# Patient Record
Sex: Female | Born: 1960 | Race: White | Hispanic: No | Smoking: Former smoker
Health system: Southern US, Community
[De-identification: ages and names within clinical notes are randomized; demographics above are authoritative.]

## PROBLEM LIST (undated history)

## (undated) DIAGNOSIS — I2510 Atherosclerotic heart disease of native coronary artery without angina pectoris: Secondary | ICD-10-CM

## (undated) DIAGNOSIS — I10 Essential (primary) hypertension: Secondary | ICD-10-CM

## (undated) DIAGNOSIS — J449 Chronic obstructive pulmonary disease, unspecified: Secondary | ICD-10-CM

## (undated) DIAGNOSIS — I509 Heart failure, unspecified: Secondary | ICD-10-CM

## (undated) DIAGNOSIS — E785 Hyperlipidemia, unspecified: Secondary | ICD-10-CM

## (undated) HISTORY — DX: Atherosclerotic heart disease of native coronary artery without angina pectoris: I25.10

## (undated) HISTORY — DX: Essential (primary) hypertension: I10

## (undated) HISTORY — PX: CARDIAC CATHETERIZATION: SHX172

## (undated) HISTORY — DX: Heart failure, unspecified: I50.9

## (undated) HISTORY — DX: Hyperlipidemia, unspecified: E78.5

---

## 2021-07-07 ENCOUNTER — Other Ambulatory Visit: Payer: Self-pay

## 2021-07-07 ENCOUNTER — Emergency Department (HOSPITAL_COMMUNITY): Payer: Self-pay

## 2021-07-07 ENCOUNTER — Inpatient Hospital Stay (HOSPITAL_COMMUNITY): Payer: Self-pay

## 2021-07-07 ENCOUNTER — Encounter (HOSPITAL_COMMUNITY): Payer: Self-pay

## 2021-07-07 ENCOUNTER — Inpatient Hospital Stay (HOSPITAL_COMMUNITY)
Admission: EM | Admit: 2021-07-07 | Discharge: 2021-07-09 | DRG: 287 | Disposition: A | Discharge status: Home / Self Care | Payer: Self-pay | Attending: Cardiology | Admitting: Cardiology

## 2021-07-07 DIAGNOSIS — I5181 Takotsubo syndrome: Principal | ICD-10-CM | POA: Diagnosis present

## 2021-07-07 DIAGNOSIS — Z20822 Contact with and (suspected) exposure to covid-19: Secondary | ICD-10-CM | POA: Diagnosis present

## 2021-07-07 DIAGNOSIS — E785 Hyperlipidemia, unspecified: Secondary | ICD-10-CM | POA: Diagnosis present

## 2021-07-07 DIAGNOSIS — I214 Non-ST elevation (NSTEMI) myocardial infarction: Principal | ICD-10-CM

## 2021-07-07 DIAGNOSIS — I25118 Atherosclerotic heart disease of native coronary artery with other forms of angina pectoris: Secondary | ICD-10-CM | POA: Diagnosis present

## 2021-07-07 DIAGNOSIS — J449 Chronic obstructive pulmonary disease, unspecified: Secondary | ICD-10-CM | POA: Diagnosis present

## 2021-07-07 DIAGNOSIS — I10 Essential (primary) hypertension: Secondary | ICD-10-CM | POA: Diagnosis present

## 2021-07-07 DIAGNOSIS — Z87891 Personal history of nicotine dependence: Secondary | ICD-10-CM

## 2021-07-07 DIAGNOSIS — I252 Old myocardial infarction: Secondary | ICD-10-CM

## 2021-07-07 DIAGNOSIS — R739 Hyperglycemia, unspecified: Secondary | ICD-10-CM | POA: Diagnosis present

## 2021-07-07 DIAGNOSIS — I472 Ventricular tachycardia, unspecified: Secondary | ICD-10-CM | POA: Diagnosis present

## 2021-07-07 HISTORY — DX: Chronic obstructive pulmonary disease, unspecified: J44.9

## 2021-07-07 LAB — MRSA NEXT GEN BY PCR, NASAL: MRSA by PCR Next Gen: NOT DETECTED

## 2021-07-07 LAB — BASIC METABOLIC PANEL
Anion gap: 10 (ref 5–15)
Anion gap: 10 (ref 5–15)
BUN: 15 mg/dL (ref 8–23)
BUN: 12 mg/dL (ref 8–23)
CO2: 25 mmol/L (ref 22–32)
CO2: 23 mmol/L (ref 22–32)
Calcium: 9.1 mg/dL (ref 8.9–10.3)
Calcium: 9 mg/dL (ref 8.9–10.3)
Chloride: 102 mmol/L (ref 98–111)
Chloride: 106 mmol/L (ref 98–111)
Creatinine, Ser: 0.98 mg/dL (ref 0.44–1.00)
Creatinine, Ser: 0.82 mg/dL (ref 0.44–1.00)
GFR, Estimated: >60 mL/min (ref >60)
GFR, Estimated: >60 mL/min (ref >60)
Glucose, Bld: 124 mg/dL — ABNORMAL HIGH (ref 70–99)
Glucose, Bld: 97 mg/dL (ref 70–99)
Potassium: 4.3 mmol/L (ref 3.5–5.1)
Potassium: 4.1 mmol/L (ref 3.5–5.1)
Sodium: 137 mmol/L (ref 135–145)
Sodium: 139 mmol/L (ref 135–145)

## 2021-07-07 LAB — CBC
HCT: 41 % (ref 36.0–46.0)
Hemoglobin: 13.2 g/dL (ref 12.0–15.0)
MCH: 29.6 pg (ref 26.0–34.0)
MCHC: 32.2 g/dL (ref 30.0–36.0)
MCV: 91.9 fL (ref 80.0–100.0)
Platelets: 242 10*3/uL (ref 150–400)
RBC: 4.46 MIL/uL (ref 3.87–5.11)
RDW: 12.3 % (ref 11.5–15.5)
WBC: 5.1 10*3/uL (ref 4.0–10.5)
nRBC: 0 % (ref 0.0–0.2)

## 2021-07-07 LAB — RESP PANEL BY RT-PCR (FLU A&B, COVID) ARPGX2
Influenza A by PCR: NEGATIVE
Influenza B by PCR: NEGATIVE
SARS Coronavirus 2 by RT PCR: NEGATIVE

## 2021-07-07 LAB — HEMOGLOBIN A1C
Hgb A1c MFr Bld: 5.1 % (ref 4.8–5.6)
Mean Plasma Glucose: 99.67 mg/dL

## 2021-07-07 LAB — D-DIMER, QUANTITATIVE: D-Dimer, Quant: <0.27 ug/mL-FEU (ref 0.00–0.50)

## 2021-07-07 LAB — TROPONIN I (HIGH SENSITIVITY)
Troponin I (High Sensitivity): 739 ng/L (ref <18)
Troponin I (High Sensitivity): 2783 ng/L (ref <18)
Troponin I (High Sensitivity): 2909 ng/L (ref <18)
Troponin I (High Sensitivity): 2584 ng/L (ref <18)
Troponin I (High Sensitivity): 2038 ng/L (ref <18)

## 2021-07-07 LAB — PROTIME-INR
INR: 1 (ref 0.8–1.2)
Prothrombin Time: 13.6 seconds (ref 11.4–15.2)

## 2021-07-07 LAB — HEPARIN LEVEL (UNFRACTIONATED): Heparin Unfractionated: 0.19 IU/mL — ABNORMAL LOW (ref 0.30–0.70)

## 2021-07-07 LAB — BRAIN NATRIURETIC PEPTIDE: B Natriuretic Peptide: 39.6 pg/mL (ref 0.0–100.0)

## 2021-07-07 MED ORDER — CLOPIDOGREL BISULFATE 300 MG PO TABS
300.0000 mg | ORAL_TABLET | ORAL | Status: AC
  Administered 2021-07-08: 300 mg via ORAL
  Filled 2021-07-07: qty 1

## 2021-07-07 MED ORDER — PANTOPRAZOLE SODIUM 40 MG PO TBEC
40.0000 mg | DELAYED_RELEASE_TABLET | Freq: Every day | ORAL | Status: DC
  Administered 2021-07-08 – 2021-07-09 (×2): 40 mg via ORAL
  Filled 2021-07-07 (×2): qty 1

## 2021-07-07 MED ORDER — NITROGLYCERIN IN D5W 200-5 MCG/ML-% IV SOLN: 5.0000 ug/min | INTRAVENOUS | Status: DC

## 2021-07-07 MED ORDER — SODIUM CHLORIDE 0.9 % IV SOLN: INTRAVENOUS | Status: DC

## 2021-07-07 MED ORDER — SODIUM CHLORIDE 0.9% FLUSH: 3.0000 mL | INTRAVENOUS | Status: DC | PRN

## 2021-07-07 MED ORDER — SODIUM CHLORIDE 0.9 % WEIGHT BASED INFUSION
1.0000 mL/kg/h | INTRAVENOUS | Status: DC
  Administered 2021-07-07: 1 mL/kg/h via INTRAVENOUS

## 2021-07-07 MED ORDER — CHLORHEXIDINE GLUCONATE CLOTH 2 % EX PADS
6.0000 | MEDICATED_PAD | Freq: Every day | CUTANEOUS | Status: DC
  Administered 2021-07-08: 6 via TOPICAL

## 2021-07-07 MED ORDER — LOSARTAN POTASSIUM 25 MG PO TABS
12.5000 mg | ORAL_TABLET | Freq: Every day | ORAL | Status: DC
  Administered 2021-07-08: 12.5 mg via ORAL
  Filled 2021-07-07: qty 1

## 2021-07-07 MED ORDER — ASPIRIN EC 81 MG PO TBEC
81.0000 mg | DELAYED_RELEASE_TABLET | Freq: Every day | ORAL | Status: DC
  Administered 2021-07-09: 81 mg via ORAL
  Filled 2021-07-07: qty 1

## 2021-07-07 MED ORDER — NITROGLYCERIN 0.4 MG SL SUBL: 0.4000 mg | SUBLINGUAL_TABLET | SUBLINGUAL | Status: DC | PRN

## 2021-07-07 MED ORDER — MORPHINE SULFATE (PF) 4 MG/ML IV SOLN
4.0000 mg | Freq: Once | INTRAVENOUS | Status: AC
  Administered 2021-07-07: 4 mg via INTRAVENOUS
  Filled 2021-07-07: qty 1

## 2021-07-07 MED ORDER — HEPARIN (PORCINE) 25000 UT/250ML-% IV SOLN
700.0000 [IU]/h | INTRAVENOUS | Status: DC
  Administered 2021-07-07: 600 [IU]/h via INTRAVENOUS
  Filled 2021-07-07: qty 250

## 2021-07-07 MED ORDER — METOPROLOL TARTRATE 12.5 MG HALF TABLET
12.5000 mg | ORAL_TABLET | Freq: Two times a day (BID) | ORAL | Status: DC
  Administered 2021-07-07 – 2021-07-08 (×2): 12.5 mg via ORAL
  Filled 2021-07-07 (×3): qty 1

## 2021-07-07 MED ORDER — SODIUM CHLORIDE 0.9% FLUSH
3.0000 mL | Freq: Two times a day (BID) | INTRAVENOUS | Status: DC
  Administered 2021-07-07 – 2021-07-08 (×3): 3 mL via INTRAVENOUS

## 2021-07-07 MED ORDER — SODIUM CHLORIDE 0.9 % IV SOLN: 250.0000 mL | INTRAVENOUS | Status: DC | PRN

## 2021-07-07 MED ORDER — ACETAMINOPHEN 325 MG PO TABS: 650.0000 mg | ORAL_TABLET | ORAL | Status: DC | PRN

## 2021-07-07 MED ORDER — ASPIRIN 81 MG PO CHEW
81.0000 mg | CHEWABLE_TABLET | ORAL | Status: AC
  Administered 2021-07-08: 81 mg via ORAL
  Filled 2021-07-07: qty 1

## 2021-07-07 MED ORDER — CLOPIDOGREL BISULFATE 300 MG PO TABS
300.0000 mg | ORAL_TABLET | Freq: Once | ORAL | Status: AC
  Administered 2021-07-07: 300 mg via ORAL
  Filled 2021-07-07: qty 1

## 2021-07-07 MED ORDER — ATORVASTATIN CALCIUM 80 MG PO TABS
80.0000 mg | ORAL_TABLET | Freq: Every day | ORAL | Status: DC
  Administered 2021-07-07 – 2021-07-09 (×3): 80 mg via ORAL
  Filled 2021-07-07 (×3): qty 1

## 2021-07-07 MED ORDER — NITROGLYCERIN 2 % TD OINT
0.5000 [in_us] | TOPICAL_OINTMENT | Freq: Once | TRANSDERMAL | Status: AC
  Administered 2021-07-07: 0.5 [in_us] via TOPICAL
  Filled 2021-07-07: qty 1

## 2021-07-07 MED ORDER — HEPARIN BOLUS VIA INFUSION
3000.0000 [IU] | Freq: Once | INTRAVENOUS | Status: AC
  Administered 2021-07-07: 3000 [IU] via INTRAVENOUS
  Filled 2021-07-07: qty 3000

## 2021-07-07 MED ORDER — ONDANSETRON HCL 4 MG/2ML IJ SOLN: 4.0000 mg | Freq: Four times a day (QID) | INTRAMUSCULAR | Status: DC | PRN

## 2021-07-07 NOTE — H&P (Signed)
Kaelynne Void is an 61 y.o. female.   Chief Complaint: Chest pain associated with shortness of breath HPI: Patient is a 61 year old female with past medical history significant for COPD, tobacco abuse smoked 1 pack/day for 30+ years quit approximately 5 years ago came to ER by EMS complaining of retrosternal chest pain radiating to left arm back and neck and left shoulder associated with diaphoresis and shortness of breath which started around 7:30 AM patient took two 325 mg aspirin prior to EMS arrival and then received 2 sublingual nitros with marked relief of chest pain and was started on IV heparin and Nitropaste with relief of chest pain in the ED.  EKG done in the ED showed normal sinus rhythm with Q waves in V1 to V3 suggestive of anterior MI age undetermined patient presently denies any chest pain but states has been having chest pressure off and on for last 2 weeks described as heaviness graded 5/10 relieved with rest states chest pressure has lasted anywhere between 15 to 20 minutes each time but has not gotten any medical attention as she thought this was related to her COPD.  2 sets of high-sensitivity troponin are elevated.  Past Medical History:  Diagnosis Date   COPD (chronic obstructive pulmonary disease) ([facility])       No family history on file. Social History:  reports that she has quit smoking. Her smoking use included cigarettes. She has never used smokeless tobacco. She reports that she does not currently use alcohol. She reports that she does not use drugs.  Allergies: No Known Allergies  (Not in a hospital admission)   Results for orders placed or performed during the hospital encounter of 07/07/21 (from the past 48 hour(s))  Basic metabolic panel     Status: Abnormal   Collection Time: 07/07/21  9:34 AM  Result Value Ref Range   Sodium 137 135 - 145 mmol/L   Potassium 4.3 3.5 - 5.1 mmol/L   Chloride 102 98 - 111 mmol/L   CO2 25 22 - 32 mmol/L   Glucose, Bld 124 (H) 70  - 99 mg/dL    Comment: Glucose reference range applies only to samples taken after fasting for at least 8 hours.   BUN 15 8 - 23 mg/dL   Creatinine, Ser 0.98 0.44 - 1.00 mg/dL   Calcium 9.1 8.9 - 10.3 mg/dL   GFR, Estimated >60 >60 mL/min   Anion gap 10 5 - 15    Comment: Performed at [facility] [address], [place], [place] [address]  CBC     Status: None   Collection Time: 07/07/21  9:34 AM  Result Value Ref Range   WBC 5.1 4.0 - 10.5 K/uL   RBC 4.46 3.87 - 5.11 MIL/uL   Hemoglobin 13.2 12.0 - 15.0 g/dL   HCT 41.0 36.0 - 46.0 %   MCV 91.9 80.0 - 100.0 fL   MCH 29.6 26.0 - 34.0 pg   MCHC 32.2 30.0 - 36.0 g/dL   RDW 12.3 11.5 - 15.5 %   Platelets 242 150 - 400 K/uL   nRBC 0.0 0.0 - 0.2 %    Comment: Performed at [facility], [place] [address], [place], [place] [address]  Troponin I (High Sensitivity)     Status: Abnormal   Collection Time: 07/07/21  9:34 AM  Result Value Ref Range   Troponin I (High Sensitivity) 739 (HH) <18 ng/L    Comment: CRITICAL RESULT CALLED TO, READ BACK BY  AND VERIFIED WITH: I.EVANS,RN 07/07/2021 AT 1102 A.HUGHES Performed at [facility], [place] [address], [place], [place] [address]   Brain natriuretic peptide     Status: None   Collection Time: 07/07/21  9:34 AM  Result Value Ref Range   B Natriuretic Peptide 39.6 0.0 - 100.0 pg/mL    Comment: Performed at [facility] [address], [place], [place] [address]  D-dimer, quantitative     Status: None   Collection Time: 07/07/21 10:12 AM  Result Value Ref Range   D-Dimer, Quant <0.27 0.00 - 0.50 ug/mL-FEU    Comment: (NOTE) At the manufacturer cut-off value of 0.5 g/mL FEU, this assay has a negative predictive value of 95-100%.This assay is intended for use in conjunction with a clinical pretest probability (PTP) assessment model to exclude pulmonary embolism (PE) and deep venous thrombosis (DVT) in outpatients suspected of PE or DVT. Results should be correlated with  clinical presentation. Performed at [facility], [place] [address] [place], [place] [address]   Protime-INR     Status: None   Collection Time: 07/07/21 10:12 AM  Result Value Ref Range   Prothrombin Time 13.6 11.4 - 15.2 seconds   INR 1.0 0.8 - 1.2    Comment: (NOTE) INR goal varies based on device and disease states. Performed at [facility], [place] [address], [place], [place] [address]   Troponin I (High Sensitivity)     Status: Abnormal   Collection Time: 07/07/21 11:22 AM  Result Value Ref Range   Troponin I (High Sensitivity) 2,783 (HH) <18 ng/L    Comment: CRITICAL VALUE NOTED.  VALUE IS CONSISTENT WITH PREVIOUSLY REPORTED AND CALLED VALUE. (NOTE) Elevated high sensitivity troponin I (hsTnI) values and significant  changes across serial measurements may suggest ACS but many other  chronic and acute conditions are known to elevate hsTnI results.  Refer to the Links section for chest pain algorithms and additional  guidance. Performed at [facility], [place] [address], [place],  [address]   Resp Panel by RT-PCR (Flu A&B, Covid) Nasopharyngeal Swab     Status: None   Collection Time: 07/07/21 11:25 AM   Specimen: Nasopharyngeal Swab; Nasopharyngeal(NP) swabs in vial transport medium  Result Value Ref Range   SARS Coronavirus 2 by RT PCR NEGATIVE NEGATIVE    Comment: (NOTE) SARS-CoV-2 target nucleic acids are NOT DETECTED.  The SARS-CoV-2 RNA is generally detectable in upper respiratory specimens during the acute phase of infection. The lowest concentration of SARS-CoV-2 viral copies this assay can detect is 138 copies/mL. A negative result does not preclude SARS-Cov-2 infection and should not be used as the sole basis for treatment or other patient management decisions. A negative result may occur with  improper specimen collection/handling, submission of specimen other than nasopharyngeal swab, presence of viral mutation(s) within the areas  targeted by this assay, and inadequate number of viral copies(<138 copies/mL). A negative result must be combined with clinical observations, patient history, and epidemiological information. The expected result is Negative.  Fact Sheet for Patients:  EntrepreneurPulse.com.au  Fact Sheet for Healthcare Providers:  IncredibleEmployment.be  This test is no t yet approved or cleared by the [place] FDA and  has been authorized for detection and/or diagnosis of SARS-CoV-2 by FDA under an Emergency Use Authorization (EUA). This EUA will remain  in effect (meaning this test can be used) for the duration of the COVID-19 declaration under Section 564(b)(1) of the Act, 21  U.S.C.section 360bbb-3(b)(1), unless the authorization is terminated  or revoked sooner.       Influenza A by PCR NEGATIVE NEGATIVE   Influenza B by PCR NEGATIVE NEGATIVE    Comment: (NOTE) The Xpert Xpress SARS-CoV-2/FLU/RSV plus assay is intended as an aid in the diagnosis of influenza from Nasopharyngeal swab specimens and should not be used as a sole basis for treatment. Nasal washings and aspirates are unacceptable for Xpert Xpress SARS-CoV-2/FLU/RSV testing.  Fact Sheet for Patients: EntrepreneurPulse.com.au  Fact Sheet for Healthcare Providers: IncredibleEmployment.be  This test is not yet approved or cleared by the [place] FDA and has been authorized for detection and/or diagnosis of SARS-CoV-2 by FDA under an Emergency Use Authorization (EUA). This EUA will remain in effect (meaning this test can be used) for the duration of the COVID-19 declaration under Section 564(b)(1) of the Act, 21 U.S.C. section 360bbb-3(b)(1), unless the authorization is terminated or revoked.  Performed at [facility], [place] [address], [place], [place] [address]    DG Chest Port 1 View  Result Date: 07/07/2021 CLINICAL DATA:  Chest pain  EXAM: PORTABLE CHEST 1 VIEW COMPARISON:  None. FINDINGS: The heart size and mediastinal contours are within normal limits. Both lungs are clear. Definitively benign, calcified nodule of the right pulmonary apex. The visualized skeletal structures are unremarkable. IMPRESSION: No acute abnormality of the lungs in AP portable projection. Electronically Signed   By: Delanna Ahmadi M.D.   On: 07/07/2021 10:06    Review of Systems  Constitutional:  Positive for diaphoresis. Negative for fever.  HENT:  Negative for sore throat.   Eyes:  Negative for discharge.  Respiratory:  Positive for shortness of breath.   Cardiovascular:  Positive for chest pain. Negative for palpitations and leg swelling.  Gastrointestinal:  Negative for abdominal distention.  Genitourinary:  Negative for difficulty urinating.  Neurological:  Negative for dizziness.   Blood pressure 116/89, pulse 93, temperature 97.6 F (36.4 C), temperature source Oral, resp. rate 17, height 5\' 1"  (1.549 m), weight 47.3 kg, SpO2 98 %. Physical Exam Constitutional:      Appearance: She is normal weight.  HENT:     Head: Normocephalic and atraumatic.  Eyes:     Extraocular Movements: Extraocular movements intact.     Pupils: Pupils are equal, round, and reactive to light.  Neck:     Vascular: No JVD.  Cardiovascular:     Rate and Rhythm: Normal rate and regular rhythm.     Heart sounds: Murmur heard.  No systolic (Soft systolic murmur noted) murmur is present.    No S3 sounds.  Pulmonary:     Effort: Pulmonary effort is normal.     Breath sounds: Normal breath sounds.  Abdominal:     General: Bowel sounds are normal. There is no abdominal bruit.     Palpations: Abdomen is soft. There is no hepatomegaly.  Musculoskeletal:     Right lower leg: No tenderness. No edema.     Left lower leg: No tenderness. No edema.  Skin:    General: Skin is warm and dry.  Neurological:     General: No focal deficit present.     Mental Status: She  is alert and oriented to person, place, and time.     Assessment/Plan Acute NSTEMI Probably recent anteroseptal wall MI COPD History of tobacco abuse Elevated blood sugar rule out diabetes mellitus Plan As per orders Discussed with patient and her husband at length regarding left cardiac catheterization possible  PTCA stenting its risk and benefits i.e. death MI stroke need for emergency CABG local vascular complications etc., radial versus femoral approach its risk and benefits and consents for the procedure..  Patient presently is chest pain-free we will schedule for tomorrow unless starts having recurrent chest pain or new EKG changes.  Charolette Forward, MD 07/07/2021, 1:25 PM

## 2021-07-07 NOTE — ED Provider Notes (Signed)
[facility] EMERGENCY DEPARTMENT Provider Note   CSN: [id number] Arrival date & time: 07/07/21  [id number]     History  Chief Complaint  Patient presents with   Chest Pain    Pamela Mcclain is a 61 y.o. female.  She has a history of COPD, former smoker, no home oxygen.  Complaining of acute onset of shortness of breath and substernal chest pain radiating to left shoulder and neck 10 out of 10 intensity while she was feeding her birds in the yard.  No prior history of chest pain.  Associated with diaphoresis.  She took 2 regular strength aspirin and EMS administered 2 nitroglycerin with improvement in pain down to 3 out of 10.  Denies any cocaine.  HPI  HPI: A 61 year old patient presents for evaluation of chest pain. Initial onset of pain was approximately 1-3 hours ago. The patient's chest pain is described as heaviness/pressure/tightness, is not worse with exertion and is relieved by nitroglycerin. The patient reports some diaphoresis. The patient's chest pain is middle- or left-sided, is not well-localized, is not sharp and does radiate to the arms/jaw/neck. The patient does not complain of nausea. The patient has no history of stroke, has no history of peripheral artery disease, has not smoked in the past 90 days, denies any history of treated diabetes, has no relevant family history of coronary artery disease (first degree relative at less than age 55), is not hypertensive, has no history of hypercholesterolemia and does not have an elevated BMI (>=30).   Home Medications Prior to Admission medications   Not on File      Allergies    Patient has no known allergies.    Review of Systems   Review of Systems  Constitutional:  Positive for diaphoresis. Negative for fever.  HENT:  Negative for sore throat.   Respiratory:  Positive for shortness of breath.   Cardiovascular:  Positive for chest pain.  Gastrointestinal:  Negative for abdominal pain.  Genitourinary:   Negative for dysuria.  Musculoskeletal:  Positive for neck pain.  Skin:  Negative for rash.  Neurological:  Negative for headaches.   Physical Exam Updated Vital Signs BP (!) 147/97    Pulse 97    Temp 97.6 F (36.4 C) (Oral)    Resp (!) 23    Ht 5\' 1"  (1.549 m)    Wt 49.9 kg    SpO2 100%    BMI 20.78 kg/m  Physical Exam Vitals and nursing note reviewed.  Constitutional:      General: She is not in acute distress.    Appearance: Normal appearance. She is well-developed.  HENT:     Head: Normocephalic and atraumatic.  Eyes:     Conjunctiva/sclera: Conjunctivae normal.  Cardiovascular:     Rate and Rhythm: Normal rate and regular rhythm.     Heart sounds: Normal heart sounds. No murmur heard. Pulmonary:     Effort: Tachypnea and accessory muscle usage present. No respiratory distress.     Breath sounds: Normal breath sounds. No stridor. No wheezing.  Abdominal:     Palpations: Abdomen is soft.     Tenderness: There is no abdominal tenderness. There is no guarding or rebound.  Musculoskeletal:        General: No tenderness or deformity. Normal range of motion.     Cervical back: Neck supple.     Right lower leg: No tenderness. No edema.     Left lower leg: No tenderness. No edema.  Skin:  General: Skin is warm and dry.     Capillary Refill: Capillary refill takes less than 2 seconds.  Neurological:     General: No focal deficit present.     Mental Status: She is alert.     GCS: GCS eye subscore is 4. GCS verbal subscore is 5. GCS motor subscore is 6.    ED Results / Procedures / Treatments   Labs (all labs ordered are listed, but only abnormal results are displayed) Labs Reviewed  BASIC METABOLIC PANEL - Abnormal; Notable for the following components:      Result Value   Glucose, Bld 124 (*)    All other components within normal limits  TROPONIN I (HIGH SENSITIVITY) - Abnormal; Notable for the following components:   Troponin I (High Sensitivity) 739 (*)    All  other components within normal limits  TROPONIN I (HIGH SENSITIVITY) - Abnormal; Notable for the following components:   Troponin I (High Sensitivity) 2,783 (*)    All other components within normal limits  TROPONIN I (HIGH SENSITIVITY) - Abnormal; Notable for the following components:   Troponin I (High Sensitivity) 2,909 (*)    All other components within normal limits  RESP PANEL BY RT-PCR (FLU A&B, COVID) ARPGX2  MRSA NEXT GEN BY PCR, NASAL  CBC  BRAIN NATRIURETIC PEPTIDE  D-DIMER, QUANTITATIVE (NOT AT [facility])  PROTIME-INR  HEMOGLOBIN [id number]  BASIC METABOLIC PANEL  HEPARIN LEVEL (UNFRACTIONATED)  HIV ANTIBODY (ROUTINE TESTING W REFLEX)  HEPARIN LEVEL (UNFRACTIONATED)  BASIC METABOLIC PANEL  LIPID PANEL  CBC    EKG EKG Interpretation  Date/Time:  Sunday July 07 2021 09:24:04 EST Ventricular Rate:  101 PR Interval:  148 QRS Duration: 72 QT Interval:  329 QTC Calculation: 427 R Axis:   84 Text Interpretation: Sinus tachycardia Anterior infarct, old No old tracing to compare Confirmed by Aletta Edouard [phone]) on 07/07/2021 9:25:35 AM  Radiology DG Chest Port 1 View  Result Date: 07/07/2021 CLINICAL DATA:  Chest pain EXAM: PORTABLE CHEST 1 VIEW COMPARISON:  None. FINDINGS: The heart size and mediastinal contours are within normal limits. Both lungs are clear. Definitively benign, calcified nodule of the right pulmonary apex. The visualized skeletal structures are unremarkable. IMPRESSION: No acute abnormality of the lungs in AP portable projection. Electronically Signed   By: Delanna Ahmadi M.D.   On: 07/07/2021 10:06    Procedures .Critical Care Performed by: Hayden Rasmussen, MD Authorized by: Hayden Rasmussen, MD   Critical care provider statement:    Critical care time (minutes):  45   Critical care time was exclusive of:  Separately billable procedures and treating other patients   Critical care was necessary to treat or prevent imminent or life-threatening  deterioration of the following conditions:  Cardiac failure   Critical care was time spent personally by me on the following activities:  Development of treatment plan with patient or surrogate, discussions with consultants, evaluation of patient's response to treatment, examination of patient, obtaining history from patient or surrogate, ordering and performing treatments and interventions, ordering and review of laboratory studies, ordering and review of radiographic studies, pulse oximetry, re-evaluation of patient's condition and review of old charts   I assumed direction of critical care for this patient from another provider in my specialty: no      Medications Ordered in ED Medications  heparin ADULT infusion 100 units/mL (25000 units/25mL) (600 Units/hr Intravenous Infusion Verify 07/07/21 1600)  aspirin EC tablet 81 mg (has no administration in time range)  nitroGLYCERIN (NITROSTAT) SL tablet 0.4 mg (has no administration in time range)  acetaminophen (TYLENOL) tablet 650 mg (has no administration in time range)  ondansetron (ZOFRAN) injection 4 mg (has no administration in time range)  sodium chloride flush (NS) 0.9 % injection 3 mL (3 mLs Intravenous Given 07/07/21 1600)  sodium chloride flush (NS) 0.9 % injection 3 mL (has no administration in time range)  0.9 %  sodium chloride infusion (has no administration in time range)  aspirin chewable tablet 81 mg (has no administration in time range)  0.9 %  sodium chloride infusion ( Intravenous New Bag/Given 07/07/21 1625)  nitroGLYCERIN 50 mg in dextrose 5 % 250 mL (0.2 mg/mL) infusion (0 mcg/min Intravenous Hold 07/07/21 1600)  metoprolol tartrate (LOPRESSOR) tablet 12.5 mg (12.5 mg Oral Given 07/07/21 1616)  atorvastatin (LIPITOR) tablet 80 mg (80 mg Oral Given 07/07/21 1615)  0.9% sodium chloride infusion (has no administration in time range)  clopidogrel (PLAVIX) tablet 300 mg (has no administration in time range)  pantoprazole (PROTONIX)  EC tablet 40 mg (has no administration in time range)  morphine (PF) 4 MG/ML injection 4 mg (4 mg Intravenous Given 07/07/21 0942)  nitroGLYCERIN (NITROGLYN) 2 % ointment 0.5 inch (0.5 inches Topical Given 07/07/21 0945)  heparin bolus via infusion 3,000 Units (3,000 Units Intravenous Bolus from Bag 07/07/21 1125)  clopidogrel (PLAVIX) tablet 300 mg (300 mg Oral Given 07/07/21 1615)    ED Course/ Medical Decision Making/ A&P Clinical Course as of 07/07/21 1717  Sun Jul 07, 2021  0948 Chest x-ray interpreted by me as COPD changes no acute infiltrate.  Awaiting radiology reading. [MB]  1219 Discussed with Dr. Terrence Dupont covering Dr. Doylene Canard.  He said he would admit her. [MB]    Clinical Course User Index [MB] Hayden Rasmussen, MD   HEAR Score: 3                       Medical Decision Making Amount and/or Complexity of Data Reviewed Labs: ordered.  Risk Prescription drug management. Decision regarding hospitalization.  This patient complains of chest pain and shortness of breath; this involves an extensive number of treatment Options and is a complaint that carries with it a high risk of complications and Morbidity. The differential includes ACS, PE, pneumothorax, pneumonia, COPD exacerbation, vascular, reflux  I ordered, reviewed and interpreted labs, which included CBC with normal white count normal hemoglobin, chemistries normal, troponins elevated and rising consistent with NSTEMI, COVID and flu negative, D-dimer negative making PE less likely, BNP not elevated I ordered medication topical nitroglycerin, IV pain medication IV heparin for NSTEMI I ordered imaging studies which included chest x-ray and I independently    visualized and interpreted imaging which showed COPD no infiltrates or pneumothorax Additional history obtained from EMS and patient's significant other Previous records obtained and reviewed in epic no recent admissions I consulted Dr. Terrence Dupont and discussed lab and  imaging findings  Critical Interventions: Initiation of heparin for patient's NSTEMI  After the interventions stated above, I reevaluated the patient and found patient to be symptomatically improved.  She will need to be admitted to hospital for further cardiac work-up likely to include cardiac catheterization.  Patient in agreement with plan for admission.          Final Clinical Impression(s) / ED Diagnoses Final diagnoses:  NSTEMI (non-ST elevated myocardial infarction) ([place])    Rx / DC Orders ED Discharge Orders     None  Hayden Rasmussen, MD 07/07/21 [phone]

## 2021-07-07 NOTE — ED Notes (Signed)
CRITICAL VALUE STICKER  CRITICAL VALUE:troponin 739  RECEIVER (on-site recipient of call): ILogan Bores  DATE & TIME NOTIFIED: 07/07/21 1102  MESSENGER (representative from lab):asia  MD NOTIFIED: Charm Barges  TIME OF NOTIFICATION:1102  RESPONSE:

## 2021-07-07 NOTE — ED Triage Notes (Signed)
Pt arrives via EMS. Pt C/O mid sternal chest pain that radiates to her left arm and back that started about 1 hour ago. Pt took two 325mg  asa prior to ems arrival. EMS administered 2 SL nitro with marked relief.

## 2021-07-07 NOTE — Progress Notes (Signed)
ANTICOAGULATION CONSULT NOTE - Initial Consult  Pharmacy Consult for Heparin Indication: chest pain/ACS  No Known Allergies  Patient Measurements: Height: 5\' 1"  (154.9 cm) Weight: 49.9 kg (110 lb) IBW/kg (Calculated) : 47.8 Heparin Dosing Weight: 49.9 kg  Vital Signs: Temp: 97.6 F (36.4 C) (02/12 0927) Temp Source: Oral (02/12 0927) BP: 115/91 (02/12 1045) Pulse Rate: 91 (02/12 1045)  Labs: Recent Labs    07/07/21 0934 07/07/21 1012  HGB 13.2  --   HCT 41.0  --   PLT 242  --   LABPROT  --  13.6  INR  --  1.0  CREATININE 0.98  --   TROPONINIHS 739*  --     Estimated Creatinine Clearance: 44.3 mL/min (by C-G formula based on SCr of 0.98 mg/dL).   Medical History: Past Medical History:  Diagnosis Date   COPD (chronic obstructive pulmonary disease) ([facility])     Medications:  (Not in a hospital admission)  Scheduled:  Infusions:  PRN:   Assessment: 42 yof with a history of COPD. Patient is presenting with chest pain. Heparin per pharmacy consult placed for chest pain/ACS.  Patient is not on anticoagulation prior to arrival.  Hgb 13.2; plt 242  Goal of Therapy:  Heparin level 0.3-0.7 units/ml Monitor platelets by anticoagulation protocol: Yes   Plan:  Give 3000 units bolus x 1 Start heparin infusion at 600 units/hr Check anti-Xa level in 6-8 hours and daily while on heparin Continue to monitor H&H and platelets  Lorelei Pont, PharmD, BCPS 07/07/2021 11:08 AM ED Clinical Pharmacist -  [phone]

## 2021-07-07 NOTE — Progress Notes (Signed)
°  Echocardiogram 2D Echocardiogram has been performed.  Pamela Mcclain 07/07/2021, 5:50 PM

## 2021-07-07 NOTE — Progress Notes (Signed)
ANTICOAGULATION CONSULT NOTE - Follow Up Consult  Pharmacy Consult for Heparin Indication: chest pain/ACS  No Known Allergies  Patient Measurements: Height: 5\' 1"  (154.9 cm) Weight: 47.3 kg (104 lb 4.4 oz) IBW/kg (Calculated) : 47.8 Heparin Dosing Weight: 49.9 kg  Vital Signs: Temp: 97.7 F (36.5 C) (02/12 2000) Temp Source: Oral (02/12 2000) BP: 97/70 (02/12 2000) Pulse Rate: 89 (02/12 2000)  Labs: Recent Labs    07/07/21 0934 07/07/21 1012 07/07/21 1122 07/07/21 1536 07/07/21 1940  HGB 13.2  --   --   --   --   HCT 41.0  --   --   --   --   PLT 242  --   --   --   --   LABPROT  --  13.6  --   --   --   INR  --  1.0  --   --   --   HEPARINUNFRC  --   --   --   --  0.19*  CREATININE 0.98  --   --  0.82  --   TROPONINIHS 739*  --  2,783* 2,909*  --      Estimated Creatinine Clearance: 52.4 mL/min (by C-G formula based on SCr of 0.82 mg/dL).   Medical History: Past Medical History:  Diagnosis Date   COPD (chronic obstructive pulmonary disease) (HCC)     Medications:  Medications Prior to Admission  Medication Sig Dispense Refill Last Dose   albuterol (VENTOLIN HFA) 108 (90 Base) MCG/ACT inhaler Inhale 2 puffs into the lungs every 6 (six) hours as needed for wheezing or shortness of breath.   07/07/2021   Ascorbic Acid (VITAMIN C PO) Take 1 tablet by mouth daily.   07/06/2021   aspirin 325 MG tablet Take 650 mg by mouth daily.   07/07/2021   budesonide-formoterol (SYMBICORT) 160-4.5 MCG/ACT inhaler Inhale 2 puffs into the lungs 2 (two) times daily.   07/06/2021   Multiple Vitamins-Minerals (ZINC PO) Take 1 tablet by mouth daily.   07/06/2021   VITAMIN D PO Take 1 tablet by mouth daily.   07/06/2021   VITAMIN E PO Take 1 tablet by mouth daily.   07/06/2021    Scheduled:  Infusions:  PRN:   Assessment: 89 yof with a history of COPD. Patient is presenting with chest pain. Heparin per pharmacy consult placed for chest pain/ACS.  Patient is not on anticoagulation  prior to arrival.  Hgb 13.2; plt 242 Heparin drip 600 uts/hr Heparin level 0.19 < goal   Goal of Therapy:  Heparin level 0.3-0.7 units/ml Monitor platelets by anticoagulation protocol: Yes   Plan:  Increase heparin infusion at 700 units/hr Daily CBC and heparin level Continue to monitor H&H and platelets    62 Pharm.D. CPP, BCPS Clinical Pharmacist [phone] 07/07/2021 8:30 PM

## 2021-07-07 NOTE — ED Notes (Signed)
Cardiologist at bedside.  

## 2021-07-08 ENCOUNTER — Encounter (HOSPITAL_COMMUNITY): Payer: Self-pay | Admitting: Cardiology

## 2021-07-08 ENCOUNTER — Other Ambulatory Visit (HOSPITAL_COMMUNITY): Payer: Self-pay

## 2021-07-08 ENCOUNTER — Inpatient Hospital Stay (HOSPITAL_COMMUNITY)
Admission: EM | Disposition: A | Discharge status: Home / Self Care | Payer: Self-pay | Source: Home / Self Care | Attending: Cardiology

## 2021-07-08 HISTORY — PX: LEFT HEART CATH AND CORONARY ANGIOGRAPHY: CATH118249

## 2021-07-08 LAB — CBC
HCT: 38.3 % (ref 36.0–46.0)
Hemoglobin: 12.3 g/dL (ref 12.0–15.0)
MCH: 30.1 pg (ref 26.0–34.0)
MCHC: 32.1 g/dL (ref 30.0–36.0)
MCV: 93.6 fL (ref 80.0–100.0)
Platelets: 220 10*3/uL (ref 150–400)
RBC: 4.09 MIL/uL (ref 3.87–5.11)
RDW: 12.6 % (ref 11.5–15.5)
WBC: 6 10*3/uL (ref 4.0–10.5)
nRBC: 0 % (ref 0.0–0.2)

## 2021-07-08 LAB — BASIC METABOLIC PANEL
Anion gap: 8 (ref 5–15)
BUN: 14 mg/dL (ref 8–23)
CO2: 23 mmol/L (ref 22–32)
Calcium: 8.8 mg/dL — ABNORMAL LOW (ref 8.9–10.3)
Chloride: 108 mmol/L (ref 98–111)
Creatinine, Ser: 0.88 mg/dL (ref 0.44–1.00)
GFR, Estimated: >60 mL/min (ref >60)
Glucose, Bld: 101 mg/dL — ABNORMAL HIGH (ref 70–99)
Potassium: 3.7 mmol/L (ref 3.5–5.1)
Sodium: 139 mmol/L (ref 135–145)

## 2021-07-08 LAB — LIPID PANEL
Cholesterol: 202 mg/dL — ABNORMAL HIGH (ref 0–200)
HDL: 57 mg/dL (ref >40)
LDL Cholesterol: 130 mg/dL — ABNORMAL HIGH (ref 0–99)
Total CHOL/HDL Ratio: 3.5 RATIO
Triglycerides: 77 mg/dL (ref <150)
VLDL: 15 mg/dL (ref 0–40)

## 2021-07-08 LAB — HEPARIN LEVEL (UNFRACTIONATED): Heparin Unfractionated: 0.29 IU/mL — ABNORMAL LOW (ref 0.30–0.70)

## 2021-07-08 LAB — GLUCOSE, CAPILLARY: Glucose-Capillary: 116 mg/dL — ABNORMAL HIGH (ref 70–99)

## 2021-07-08 LAB — ECHOCARDIOGRAM COMPLETE
Area-P 1/2: 5.13 cm2
Height: 61 in
S' Lateral: 3.4 cm
Single Plane A4C EF: 33.1 %
Weight: 1668.44 oz

## 2021-07-08 LAB — HIV ANTIBODY (ROUTINE TESTING W REFLEX): HIV Screen 4th Generation wRfx: NONREACTIVE

## 2021-07-08 LAB — POCT ACTIVATED CLOTTING TIME: Activated Clotting Time: 155 seconds

## 2021-07-08 LAB — MAGNESIUM: Magnesium: 1.9 mg/dL (ref 1.7–2.4)

## 2021-07-08 SURGERY — LEFT HEART CATH AND CORONARY ANGIOGRAPHY
Anesthesia: LOCAL

## 2021-07-08 MED ORDER — SODIUM CHLORIDE 0.9% FLUSH: 3.0000 mL | INTRAVENOUS | Status: DC | PRN

## 2021-07-08 MED ORDER — SODIUM CHLORIDE 0.9 % IV SOLN: INTRAVENOUS | Status: AC

## 2021-07-08 MED ORDER — SACUBITRIL-VALSARTAN 24-26 MG PO TABS
1.0000 | ORAL_TABLET | Freq: Two times a day (BID) | ORAL | Status: DC
  Administered 2021-07-08 – 2021-07-09 (×3): 1 via ORAL
  Filled 2021-07-08 (×4): qty 1

## 2021-07-08 MED ORDER — SODIUM CHLORIDE 0.9 % IV SOLN: 250.0000 mL | INTRAVENOUS | Status: DC | PRN

## 2021-07-08 MED ORDER — CARVEDILOL 3.125 MG PO TABS
3.1250 mg | ORAL_TABLET | Freq: Two times a day (BID) | ORAL | Status: DC
  Administered 2021-07-08 – 2021-07-09 (×2): 3.125 mg via ORAL
  Filled 2021-07-08 (×2): qty 1

## 2021-07-08 MED ORDER — MIDAZOLAM HCL 2 MG/2ML IJ SOLN
INTRAMUSCULAR | Status: DC | PRN
  Administered 2021-07-08: 1 mg via INTRAVENOUS

## 2021-07-08 MED ORDER — POTASSIUM CHLORIDE CRYS ER 20 MEQ PO TBCR
40.0000 meq | EXTENDED_RELEASE_TABLET | Freq: Once | ORAL | Status: AC
  Administered 2021-07-08: 40 meq via ORAL
  Filled 2021-07-08: qty 2

## 2021-07-08 MED ORDER — IOHEXOL 350 MG/ML SOLN
INTRAVENOUS | Status: DC | PRN
  Administered 2021-07-08: 70 mL

## 2021-07-08 MED ORDER — SODIUM CHLORIDE 0.9% FLUSH
3.0000 mL | Freq: Two times a day (BID) | INTRAVENOUS | Status: DC
  Administered 2021-07-08 – 2021-07-09 (×3): 3 mL via INTRAVENOUS

## 2021-07-08 MED ORDER — HEPARIN (PORCINE) IN NACL 1000-0.9 UT/500ML-% IV SOLN
INTRAVENOUS | Status: DC | PRN
  Administered 2021-07-08 (×2): 500 mL

## 2021-07-08 MED ORDER — HYDRALAZINE HCL 20 MG/ML IJ SOLN: 10.0000 mg | INTRAMUSCULAR | Status: AC | PRN

## 2021-07-08 MED ORDER — LIDOCAINE HCL (PF) 1 % IJ SOLN
INTRAMUSCULAR | Status: AC
  Filled 2021-07-08: qty 30

## 2021-07-08 MED ORDER — ACETAMINOPHEN 325 MG PO TABS: 650.0000 mg | ORAL_TABLET | ORAL | Status: DC | PRN

## 2021-07-08 MED ORDER — ONDANSETRON HCL 4 MG/2ML IJ SOLN: 4.0000 mg | Freq: Four times a day (QID) | INTRAMUSCULAR | Status: DC | PRN

## 2021-07-08 MED ORDER — MIDAZOLAM HCL 2 MG/2ML IJ SOLN
INTRAMUSCULAR | Status: AC
  Filled 2021-07-08: qty 2

## 2021-07-08 MED ORDER — HEPARIN (PORCINE) IN NACL 1000-0.9 UT/500ML-% IV SOLN
INTRAVENOUS | Status: AC
  Filled 2021-07-08: qty 1000

## 2021-07-08 MED ORDER — LIDOCAINE HCL (PF) 1 % IJ SOLN
INTRAMUSCULAR | Status: DC | PRN
  Administered 2021-07-08: 15 mL via INTRADERMAL

## 2021-07-08 SURGICAL SUPPLY — 7 items
CATH INFINITI 5FR MULTPACK ANG (CATHETERS) ×1 IMPLANT
KIT HEART LEFT (KITS) ×2 IMPLANT
PACK CARDIAC CATHETERIZATION (CUSTOM PROCEDURE TRAY) ×2 IMPLANT
SHEATH PINNACLE 5F 10CM (SHEATH) ×1 IMPLANT
SYR MEDRAD MARK 7 150ML (SYRINGE) ×2 IMPLANT
TRANSDUCER W/STOPCOCK (MISCELLANEOUS) ×2 IMPLANT
WIRE EMERALD 3MM-J .035X150CM (WIRE) ×1 IMPLANT

## 2021-07-08 NOTE — Progress Notes (Signed)
Subjective:  Doing well.  Denies any chest pain or shortness of breath.  Tolerated left cardiac catheterization, noted to have patent coronary arteries, but markedly depressed LV systolic function suggestive of  Takotsanu cardiomyopathy  Objective:  Vital Signs in the last 24 hours: Temp:  [97.7 F (36.5 C)-98.2 F (36.8 C)] 98 F (36.7 C) (02/13 0918) Pulse Rate:  [62-90] 82 (02/13 1614) Resp:  [12-24] 24 (02/13 1500) BP: (83-153)/(63-102) 111/69 (02/13 1614) SpO2:  [94 %-100 %] 100 % (02/13 1500) Weight:  [47.9 kg] 47.9 kg (02/13 0630)  Intake/Output from previous day: 02/12 0701 - 02/13 0700 In: 1291.4 [P.O.:480; I.V.:811.4] Out: 1300 [Urine:1300] Intake/Output from this shift: Total I/O In: 143.5 [I.V.:143.5] Out: 0   Physical Exam: Exam unchanged. Right groin No evidence of hematoma, dressing dry Lab Results: Recent Labs    07/07/21 0934 07/08/21 0520  WBC 5.1 6.0  HGB 13.2 12.3  PLT 242 220   Recent Labs    07/07/21 1536 07/08/21 0520  NA 139 139  K 4.1 3.7  CL 106 108  CO2 23 23  GLUCOSE 97 101*  BUN 12 14  CREATININE 0.82 0.88   No results for input(s): TROPONINI in the last 72 hours.  Invalid input(s): CK, MB Hepatic Function Panel No results for input(s): PROT, ALBUMIN, AST, ALT, ALKPHOS, BILITOT, BILIDIR, IBILI in the last 72 hours. Recent Labs    07/08/21 0520  CHOL 202*   No results for input(s): PROTIME in the last 72 hours.  Imaging: CARDIAC CATHETERIZATION  Result Date: 07/08/2021   Prox RCA to Mid RCA lesion is 30% stenosed.   There is severe left ventricular systolic dysfunction.   The left ventricular ejection fraction is 25-35% by visual estimate.   DG Chest Port 1 View  Result Date: 07/07/2021 CLINICAL DATA:  Chest pain EXAM: PORTABLE CHEST 1 VIEW COMPARISON:  None. FINDINGS: The heart size and mediastinal contours are within normal limits. Both lungs are clear. Definitively benign, calcified nodule of the right pulmonary apex.  The visualized skeletal structures are unremarkable. IMPRESSION: No acute abnormality of the lungs in AP portable projection. Electronically Signed   By: Delanna Ahmadi M.D.   On: 07/07/2021 10:06   ECHOCARDIOGRAM COMPLETE  Result Date: 07/08/2021    ECHOCARDIOGRAM REPORT   Patient Name:   PARNELL ELDER Date of Exam: 07/07/2021 Medical Rec #:  [id number]      Height:       61.0 in Accession #:    [id number]     Weight:       104.3 lb Date of Birth:  08/10/1960      BSA:          1.432 m Patient Age:    61 years       BP:           116/93 mmHg Patient Gender: F              HR:           86 bpm. Exam Location:  Inpatient Procedure: 2D Echo Indications:     NSTEMI  History:         Patient has no prior history of Echocardiogram examinations.                  COPD, Signs/Symptoms:Chest Pain and Shortness of Breath; Risk                  Factors:Former Smoker.  Sonographer:     Ander Purpura  [facility] RDCS Referring Phys:  [place] Diagnosing Phys: Charolette Forward MD IMPRESSIONS  1. Left ventricular ejection fraction, by estimation, is 25 to 30%. The left ventricle has severely decreased function. The left ventricle demonstrates regional wall motion abnormalities (see scoring diagram/findings for description). The left ventricular internal cavity size was moderately dilated. Left ventricular diastolic function could not be evaluated.  2. Right ventricular systolic function is normal. The right ventricular size is normal. There is normal pulmonary artery systolic pressure.  3. The mitral valve is normal in structure. Trivial mitral valve regurgitation.  4. The aortic valve is normal in structure. Aortic valve regurgitation is not visualized.  5. The inferior vena cava is normal in size with <50% respiratory variability, suggesting right atrial pressure of 8 mmHg. FINDINGS  Left Ventricle: Left ventricular ejection fraction, by estimation, is 25 to 30%. The left ventricle has severely decreased function. The left  ventricle demonstrates regional wall motion abnormalities. Severe hypokinesis of the left ventricular, mid-apical anteroseptal wall. Severe hypokinesis of the left ventricular, mid-apical inferoseptal wall. The left ventricular internal cavity size was moderately dilated. There is no left ventricular hypertrophy. Left ventricular diastolic function could not be evaluated. Right Ventricle: The right ventricular size is normal. No increase in right ventricular wall thickness. Right ventricular systolic function is normal. There is normal pulmonary artery systolic pressure. The tricuspid regurgitant velocity is 2.86 m/s, and  with an assumed right atrial pressure of 3 mmHg, the estimated right ventricular systolic pressure is 123XX123 mmHg. Left Atrium: Left atrial size was normal in size. Right Atrium: Right atrial size was normal in size. Pericardium: There is no evidence of pericardial effusion. Mitral Valve: The mitral valve is normal in structure. Trivial mitral valve regurgitation. Tricuspid Valve: The tricuspid valve is normal in structure. Tricuspid valve regurgitation is trivial. Aortic Valve: The aortic valve is normal in structure. Aortic valve regurgitation is not visualized. Pulmonic Valve: The pulmonic valve was normal in structure. Pulmonic valve regurgitation is not visualized. Aorta: The aortic root is normal in size and structure. Venous: The inferior vena cava is normal in size with less than 50% respiratory variability, suggesting right atrial pressure of 8 mmHg. IAS/Shunts: No atrial level shunt detected by color flow Doppler.  LEFT VENTRICLE PLAX 2D LVIDd:         4.50 cm     Diastology LVIDs:         3.40 cm     LV e' medial:    5.33 cm/s LV PW:         0.80 cm     LV E/e' medial:  16.3 LV IVS:        0.70 cm     LV e' lateral:   6.09 cm/s LVOT diam:     1.70 cm     LV E/e' lateral: 14.3 LV SV:         34 LV SV Index:   23 LVOT Area:     2.27 cm  LV Volumes (MOD) LV vol d, MOD A4C: 61.3 ml LV vol s,  MOD A4C: 41.0 ml LV SV MOD A4C:     61.3 ml RIGHT VENTRICLE            IVC RV S prime:     9.68 cm/s  IVC diam: 1.60 cm TAPSE (M-mode): 1.4 cm LEFT ATRIUM             Index        RIGHT ATRIUM  Index LA diam:        3.40 cm 2.37 cm/m   RA Area:     7.27 cm LA Vol (A2C):   29.3 ml 20.46 ml/m  RA Volume:   12.70 ml 8.87 ml/m LA Vol (A4C):   29.0 ml 20.25 ml/m LA Biplane Vol: 29.5 ml 20.60 ml/m  AORTIC VALVE LVOT Vmax:   82.60 cm/s LVOT Vmean:  53.600 cm/s LVOT VTI:    0.148 m  AORTA Ao Root diam: 2.50 cm Ao Asc diam:  2.80 cm MITRAL VALVE                TRICUSPID VALVE MV Area (PHT): 5.13 cm     TR Peak grad:   32.7 mmHg MV Decel Time: 148 msec     TR Vmax:        286.00 cm/s MV E velocity: 87.00 cm/s MV A velocity: 101.00 cm/s  SHUNTS MV E/A ratio:  0.86         Systemic VTI:  0.15 m                             Systemic Diam: 1.70 cm Charolette Forward MD Electronically signed by Charolette Forward MD Signature Date/Time: 07/08/2021/11:46:02 AM    Final     Cardiac Studies:  Assessment/Plan:  Takotsabu cardiomyopathy hypertension COPD Tobacco abuse. Hyperlipidemia. Status post nonsustained VT Plan Switch metoprolol to carvedilol as per orders. Search losartan to entresto as per orders Replace K Check magnesium. Will add low-dose dose spironolactone as blood pressure tolerates  LOS: 1 day    Charolette Forward 07/08/2021, 4:31 PM

## 2021-07-08 NOTE — Progress Notes (Addendum)
Site area: Right groin a5 french arterial sheath was removed ? ?Site Prior to Removal:  Level 0 ? ?Pressure Applied For 20 MINUTES   ? ?Bedrest  Beginning at 0845am X 4 hours ? ?Manual:   Yes.   ? ?Patient Status During Pull:  stable ? ?Post Pull Groin Site:  Level 0 ? ?Post Pull Instructions Given:  Yes.   ? ?Post Pull Pulses Present:  Yes.   ? ?Dressing Applied:  Yes.   ? ?Comments:    ?

## 2021-07-08 NOTE — Plan of Care (Signed)
Stable during shift. CP free, slight SOB/wheeze with ambulation this am.   Pre cath teaching done with pt/husband. Plan for cardiac cath this am.   Problem: Education: Goal: Knowledge of General Education information will improve Description: Including pain rating scale, medication(s)/side effects and non-pharmacologic comfort measures Outcome: Progressing   Problem: Health Behavior/Discharge Planning: Goal: Ability to manage health-related needs will improve Outcome: Progressing   Problem: Clinical Measurements: Goal: Ability to maintain clinical measurements within normal limits will improve Outcome: Progressing Goal: Will remain free from infection Outcome: Progressing Goal: Respiratory complications will improve Outcome: Progressing Goal: Cardiovascular complication will be avoided Outcome: Progressing   Problem: Activity: Goal: Risk for activity intolerance will decrease Outcome: Progressing   Problem: Nutrition: Goal: Adequate nutrition will be maintained Outcome: Progressing   Problem: Coping: Goal: Level of anxiety will decrease Outcome: Progressing   Problem: Elimination: Goal: Will not experience complications related to urinary retention Outcome: Progressing   Problem: Pain Managment: Goal: General experience of comfort will improve Outcome: Progressing   Problem: Safety: Goal: Ability to remain free from injury will improve Outcome: Progressing   Problem: Skin Integrity: Goal: Risk for impaired skin integrity will decrease Outcome: Progressing

## 2021-07-08 NOTE — Progress Notes (Signed)
Patient had a 19-beat Vtach episode at [phone].  She c/o chest tightness and mild nausea that quickly subsided.  She is currently in SR on RA with no further symptoms.  MD notified via secure chat.

## 2021-07-08 NOTE — Interval H&P Note (Signed)
Cath Lab Visit (complete for each Cath Lab visit)  Clinical Evaluation Leading to the Procedure:   ACS: Yes.    Non-ACS:    Anginal Classification: CCS IV  Anti-ischemic medical therapy: Maximal Therapy (2 or more classes of medications)  Non-Invasive Test Results: No non-invasive testing performed  Prior CABG: No previous CABG      History and Physical Interval Note:  07/08/2021 7:25 AM  Pamela Mcclain  has presented today for surgery, with the diagnosis of NSTEMI.  The various methods of treatment have been discussed with the patient and family. After consideration of risks, benefits and other options for treatment, the patient has consented to  Procedure(s): LEFT HEART CATH AND CORONARY ANGIOGRAPHY (N/A) as a surgical intervention.  The patient's history has been reviewed, patient examined, no change in status, stable for surgery.  I have reviewed the patient's chart and labs.  Questions were answered to the patient's satisfaction.     Rinaldo Cloud

## 2021-07-09 ENCOUNTER — Encounter (HOSPITAL_COMMUNITY): Payer: Self-pay | Admitting: Cardiology

## 2021-07-09 ENCOUNTER — Other Ambulatory Visit (HOSPITAL_COMMUNITY): Payer: Self-pay

## 2021-07-09 LAB — BASIC METABOLIC PANEL
Anion gap: 9 (ref 5–15)
BUN: 11 mg/dL (ref 8–23)
CO2: 23 mmol/L (ref 22–32)
Calcium: 9 mg/dL (ref 8.9–10.3)
Chloride: 107 mmol/L (ref 98–111)
Creatinine, Ser: 0.84 mg/dL (ref 0.44–1.00)
GFR, Estimated: >60 mL/min (ref >60)
Glucose, Bld: 114 mg/dL — ABNORMAL HIGH (ref 70–99)
Potassium: 4.2 mmol/L (ref 3.5–5.1)
Sodium: 139 mmol/L (ref 135–145)

## 2021-07-09 MED ORDER — ATORVASTATIN CALCIUM 80 MG PO TABS
80.0000 mg | ORAL_TABLET | Freq: Every day | ORAL | 3 refills | Status: DC
  Filled 2021-07-09: qty 30, 30d supply, fill #0

## 2021-07-09 MED ORDER — ASPIRIN 81 MG PO TBEC
81.0000 mg | DELAYED_RELEASE_TABLET | Freq: Every day | ORAL | 11 refills | Status: DC
  Filled 2021-07-09: qty 30, 30d supply, fill #0

## 2021-07-09 MED ORDER — CARVEDILOL 3.125 MG PO TABS
3.1250 mg | ORAL_TABLET | Freq: Two times a day (BID) | ORAL | 3 refills | Status: DC
  Filled 2021-07-09: qty 60, 30d supply, fill #0

## 2021-07-09 MED ORDER — SACUBITRIL-VALSARTAN 24-26 MG PO TABS
1.0000 | ORAL_TABLET | Freq: Two times a day (BID) | ORAL | 3 refills | Status: DC
  Filled 2021-07-09: qty 60, 30d supply, fill #0

## 2021-07-09 MED FILL — Nitroglycerin IV Soln 100 MCG/ML in D5W: INTRA_ARTERIAL | Qty: 10 | Status: AC

## 2021-07-09 NOTE — TOC Transition Note (Addendum)
Transition of Care [facility]) - CM/SW Discharge Note   Patient Details  Name: Pamela Mcclain MRN: [id number] Date of Birth: December 23, 1960  Transition of Care [facility]) CM/SW Contact:  Leone Haven, RN Phone Number: 07/09/2021, 10:59 AM   Clinical Narrative:    From home with spouse, here with chest pain, s/p cath, will medically manage. For poss dc today,  patient with no insurance or PCP. Will need to get follow up at a [facility] clinic and will need med ast at discharge.  Follow up apt on [facility], Match letter done to ast with mediations.  TOC to fill meds. NCM gave patient the patient ast application to take to follow up apt.     Final next level of care: Home/Self Care Barriers to Discharge: No Barriers Identified   Patient Goals and CMS Choice     Choice offered to / list presented to : NA  Discharge Placement                       Discharge Plan and Services                  DME Agency: NA       HH Arranged: NA          Social Determinants of Health (SDOH) Interventions Food Insecurity Interventions: Intervention Not Indicated Financial Strain Interventions: Other (Comment) (referred to financial counseling and HF CSW) Housing Interventions: Intervention Not Indicated Transportation Interventions: Intervention Not Indicated   Readmission Risk Interventions No flowsheet data found.

## 2021-07-09 NOTE — Progress Notes (Signed)
Heart Failure Stewardship Pharmacist Progress Note   PCP: Pcp, No PCP-Cardiologist: None    HPI:  61 yo F with PMH of COPD and prior tobacco use. She presented to the ED on 2/12 with chest pain and shortness of breath. ECHO done 2/12 and LVEF is 25-30%. LHC done 2/13 and found to have patent coronary arteries, markedly depressed LV function suggestive of Takotsubo cardiomyopathy.  Current HF Medications: Beta Blocker: carvedilol 3.125 mg BID ACE/ARB/ARNI: Entresto 24/26 mg BID  Prior to admission HF Medications: None  Pertinent Lab Values: Serum creatinine 0.84, BUN 11, Potassium 4.2, Sodium 139, BNP 39.6, Magnesium 1.9, A1c 5.1  Vital Signs: Weight: 104 lbs (admission weight: 104 lbs) Blood pressure: 110/70s  Heart rate: 70s  I/O: -55mL yesterday; net +553mL  Medication Assistance / Insurance Benefits Check: Does the patient have prescription insurance?  No  Does the patient qualify for medication assistance through manufacturers or grants?   Pending Eligible grants and/or patient assistance programs: pending Medication assistance applications in progress: none  Medication assistance applications approved: none Approved medication assistance renewals will be completed by: TBD  Outpatient Pharmacy:  Prior to admission [facility] Is the patient willing to use [facility] at discharge? Yes Is the patient willing to transition their outpatient pharmacy to utilize a [facility] outpatient pharmacy?   Pending    Assessment: 1. Acute systolic CHF (EF 123XX123), due to Takotsubo cardiomyopathy. NYHA class II symptoms. - No diuretics, weight stable  - Continue carvedilol 3.125 mg BID - Continue Entresto 24/26 mg BID - Consider adding spironolactone 12.5 mg daily - Consider SGLT2i at discharge or follow up   Plan: 1) Medication changes recommended at this time: - Add spironolactone 12.5 mg daily  2) Patient assistance: - No prescription insurance  - Can  start patient assistance once HF regimen determined  3)  Education  - To be completed prior to discharge  Kerby Nora, PharmD, BCPS Heart Failure Cytogeneticist Phone [phone]

## 2021-07-09 NOTE — Discharge Summary (Signed)
NAMETRIXY, EVERSON MEDICAL RECORD NO: [id number] ACCOUNT NO: [id number] DATE OF BIRTH: 01-Mar-1961 FACILITY: [facility] LOCATION: [facility] PHYSICIAN: Allegra Lai. Terrence Dupont, MD  Discharge Summary   DATE OF ADMISSION:  07/07/2021.  DATE OF DISCHARGE:  07/09/2021.  ADMISSION DIAGNOSES:   1.  Acute NSTEMI. 2.  Probable recent anteroseptal wall MI. 3.  COPD. 4.  Tobacco abuse. 5.  Elevated blood sugar, rule out diabetes mellitus.  DISCHARGE DIAGNOSES:   1.  Takotsubo cardiomyopathy, status post left cardiac catheterization. 2.  Hypertension. 3.  Hyperlipidemia. 4.  Chronic obstructive pulmonary disease. 5.  Tobacco abuse. 6.  Status post nonsustained VT, asymptomatic.  DISCHARGE HOME MEDICATIONS:   1.  Aspirin 81 mg daily. 2.  Atorvastatin 80 mg daily. 3.  Carvedilol 3.125 mg 1 tablet twice daily. 4.  Entresto 24/26 mg twice daily. 5.  Albuterol inhaler 2 puffs every 6 hours as needed. 6.  Symbicort 2 puffs twice daily as needed. 7.  Vitamin C 1 tablet daily. 8.  Vitamin D 1 tablet daily. 9.  Vitamin E 1 tablet daily. 10.  Zinc 1 tablet daily as before.  DIET:  Low salt, low cholesterol, 1800 calories ADA diet.  ACTIVITY:  Increase activity slowly as tolerated.  Post-cardiac catheterization instructions have been given.  Follow up with me in 1 week.  CONDITION AT DISCHARGE:  Stable.  BRIEF HISTORY:  The patient is a 61 year old female with past medical history significant for COPD, tobacco abuse, smoked 1 pack per day for 30+ years, quit approximately 5 years ago.  She came to the ER by EMS complaining of retrosternal chest pain  radiating to left arm, back and neck and left shoulder associated with diaphoresis and shortness of breath, which started around 7:30 a.m.  The patient took two 325 mg of aspirin prior to EMS arrival and then received 2 sublingual nitro with marked  improvement in her chest pain and was started on IV heparin and nitro paste in the ED with relief of chest pain.   EKG done in the ED showed normal sinus rhythm with Q-waves in V1-V3 suggestive of anterior MI, age undetermined.  The patient presently  denies any chest pain, but states have been having chest pressure off and on for the last 2 weeks, described as heaviness grade 5/10, relieved with rest.  States the chest pressure has lasted anywhere between 15-20 minutes each time, but has not gotten  any medical attention as she thought it was related to her COPD.  Two sets of high sensitivity troponin I were elevated in ED.  PHYSICAL EXAMINATION: GENERAL:  She was alert, awake, oriented x3. VITAL SIGNS:  Blood pressure was 116/89, pulse 93.  She was afebrile. HEENT:  Conjunctivae was pink. NECK:  Supple, no JVD, no bruit. LUNGS:  Clear to auscultation without rhonchi, or rales. CARDIOVASCULAR:  S1, S2 was normal.  There was soft systolic murmur.  No S3 gallop. ABDOMEN:  Soft.  Bowel sounds present, nontender. EXTREMITIES:  There is no clubbing, cyanosis or edema.  PERTINENT LABORATORY DATA:  Sodium was 137, potassium 4.3, BUN was 15, creatinine 0.98, glucose was 124.  Repeat blood sugar was 97.  Her hemoglobin was 13.2, hematocrit 41.0, white count of 5.1.  High-sensitivity troponin I was 739.  Her second set was  2783, third set 2909, fourth set 2584, which is trending down.  Next set was 2038.  Her magnesium was 1.9.  Repeat electrolytes, sodium 139, potassium 3.7, glucose 101, BUN 14, creatinine 0.88.  Her cholesterol  was 202, HDL 57 and LDL was elevated to  130, triglycerides were 77.  BRIEF HOSPITAL COURSE:  The patient was admitted to CCU.  The patient ruled in for myocardial infarction due to chest pain and elevated enzymes.  Subsequently, the patient underwent left cardiac catheterization as per procedure report, the patient was  noted to have nonobstructive mild CAD in the RCA, LAD and left circumflex were patent.  The patient had a Takotsubo cardiomyopathy.  The patient was started on initially  metoprolol and losartan, which has been switched to carvedilol and Entresto, which  she is tolerating well.  Post-procedure, the patient did not have any chest pains, but did have an episode of a few beats of nonsustained VT, asymptomatic on the monitor.  Her potassium was replaced.  Today's potassium is 4.2.  The patient did not have  any further episodes of V-tach during the hospital stay.  Her mag is 1.9.  The patient has ambulated in the room without any problems.  Her groin is stable with no evidence of hematoma or bruit.  The patient will be discharged home on above medications.  We will up titrate her and Entresto and carvedilol as tolerated.  We will consider starting SGLT2 inhibitor and mineralocorticoid inhibitors as outpatient as blood pressure tolerates.   PUS D: 07/09/2021 11:21:59 am T: 07/09/2021 2:03:00 pm  JOB: [id number]

## 2021-07-09 NOTE — Progress Notes (Addendum)
Heart Failure Navigation Team Progress Note  PCP: Pcp, No Primary Cardiologist: Tyler Aas., MD Admitted from: home with partner  Past Medical History:  Diagnosis Date   COPD (chronic obstructive pulmonary disease) (HCC)     Social History   Socioeconomic History   Marital status: Domestic Partner    Spouse name: Felicie Morn   Number of children: 3   Years of education: Not on file   Highest education level: 10th grade  Occupational History   Occupation: self employed  Tobacco Use   Smoking status: Former    Packs/day: 1.00    Years: 38.00    Pack years: 38.00    Types: Cigarettes    Quit date: 05/26/2016    Years since quitting: 5.1   Smokeless tobacco: Never  Vaping Use   Vaping Use: Never used  Substance and Sexual Activity   Alcohol use: Not Currently   Drug use: Never   Sexual activity: Not on file  Other Topics Concern   Not on file  Social History Narrative   Not on file   Social Determinants of Health   Financial Resource Strain: Low Risk    Difficulty of Paying Living Expenses: Not very hard  Food Insecurity: No Food Insecurity   Worried About Programme researcher, broadcasting/film/video in the Last Year: Never true   Ran Out of Food in the Last Year: Never true  Transportation Needs: No Transportation Needs   Lack of Transportation (Medical): No   Lack of Transportation (Non-Medical): No  Physical Activity: Not on file  Stress: Not on file  Social Connections: Not on file     Heart & Vascular Transition of Care Clinic follow-up: Scheduled for 07/19/21 @10am .  Confirmed transportation.  Immediate social needs: , and a primary care provider (RNCM set up an appointment for primary care on 2/21 at the patient care center and this information can be found on the patient's AVS).  HF CSW reached out to [employer] about screening Ms. Marksberry for Medicaid. [employer] reported Ms. Merida is on the list to be screened for Medicaid today.  Dennise Raabe, MSW,  LCSW [phone] Heart Failure Social Worker

## 2021-07-09 NOTE — Progress Notes (Signed)
CARDIAC REHAB PHASE I   PRE:  Rate/Rhythm: 80 SR    BP: sitting 88/65    SaO2: 94 RA  MODE:  Ambulation: 340 ft   POST:  Rate/Rhythm: 105 ST    BP: sitting 104/80     SaO2: 96 RA  Pt with significant SOB with distance. Had to rest x2. SaO2 stable. Sts this is more SOB than her baseline.   Discussed with pt and significant other takotsubo/MI, restrictions, HF management, daily wts, low sodium, walking as tolerated and CRPII. Receptive. Will refer to [place], ACSM 07/09/2021 12:40 PM

## 2021-07-09 NOTE — TOC Progression Note (Addendum)
Transition of Care [facility]) - Progression Note    Patient Details  Name: Pamela Mcclain MRN: [id number] Date of Birth: 28-Jun-1960  Transition of Care [facility]) CM/SW Contact  Leone Haven, RN Phone Number: 07/09/2021, 10:42 AM  Clinical Narrative:    From home with spouse, here with chest pain, s/p cath, will medically manage. For poss dc today,  patient with no insurance or PCP. Will need to get follow up at a [facility] and will need med ast at discharge.  Follow up apt on [facility], Match letter done to ast with mediations.         Expected Discharge Plan and Services                                                 Social Determinants of Health (SDOH) Interventions Food Insecurity Interventions: Intervention Not Indicated Financial Strain Interventions: Other (Comment) (referred to financial counseling and HF CSW) Housing Interventions: Intervention Not Indicated Transportation Interventions: Intervention Not Indicated  Readmission Risk Interventions No flowsheet data found.

## 2021-07-09 NOTE — Progress Notes (Signed)
Heart Failure Nurse Navigator Progress Note  PCP: Pcp, No PCP-Cardiologist: Tyler Aas., MD Admission Diagnosis: NSTEMI, NEW aCHF Admitted from: home with significant other  Presentation:   Pamela Mcclain presented 2/12 with chest pressure and pain radiating down left arm. Pt resting in bed on room air. Significant other, Pamela Mcclain, at bedside. Pt states she is self-employed with [employer]. She does not have insurance. Quit smoking in 2018, 38PY history. Denies alcohol and illicit drug use. Pt states she eats most meals at home. Drinks 3-4 cups of coffee a day, 1 pepsi, and 2 glasses of water. Utilizes salt shaker. Educated on dietary/fluid modifications. Explained briefly medication regimen. Pt states she does not have PCP. Referral made to HF CSW to assist with PCP and financial counseling regarding lack of insurance. Pt states she does not drive, S/O or daughter will drive. Daughter lives next door.  Explained benefits of Heart & Vascular Transitions of Care Clinic appointment, patient agreeable.    ECHO/ LVEF: 25-30%  Clinical Course:  Past Medical History:  Diagnosis Date   COPD (chronic obstructive pulmonary disease) (HCC)      Social History   Socioeconomic History   Marital status: Domestic Partner    Spouse name: Pamela Mcclain   Number of children: 3   Years of education: Not on file   Highest education level: 10th grade  Occupational History   Occupation: self employed  Tobacco Use   Smoking status: Former    Packs/day: 1.00    Years: 38.00    Pack years: 38.00    Types: Cigarettes    Quit date: 05/26/2016    Years since quitting: 5.1   Smokeless tobacco: Never  Vaping Use   Vaping Use: Never used  Substance and Sexual Activity   Alcohol use: Not Currently   Drug use: Never   Sexual activity: Not on file  Other Topics Concern   Not on file  Social History Narrative   Not on file   Social Determinants of Health   Financial Resource Strain: Low Risk    Difficulty of  Paying Living Expenses: Not very hard  Food Insecurity: No Food Insecurity   Worried About Programme researcher, broadcasting/film/video in the Last Year: Never true   Ran Out of Food in the Last Year: Never true  Transportation Needs: No Transportation Needs   Lack of Transportation (Medical): No   Lack of Transportation (Non-Medical): No  Physical Activity: Not on file  Stress: Not on file  Social Connections: Not on file    High Risk Criteria for Readmission and/or Poor Patient Outcomes: Heart failure hospital admissions (last 6 months): 1  No Show rate: NA Difficult social situation: no Demonstrates medication adherence: NA Primary Language: English Literacy level: Able to read/write and comprehend. Wears glasses  Education Assessment and Provision:  Detailed education and instructions provided on heart failure disease management including the following:  Signs and symptoms of Heart Failure When to call the physician Importance of daily weights Low sodium diet Fluid restriction Medication management Anticipated future follow-up appointments  Patient education given on each of the above topics.  Patient acknowledges understanding via teach back method and acceptance of all instructions.  Education Materials:  "Living Better With Heart Failure" Booklet, HF zone tool, & Daily Weight Tracker Tool.  Patient has scale at home: yes, will get from daughter Patient has pill box at home: no, declined.    Barriers of Care:   -new dx -sodium intake -no insurance -medication cost -no PCP  Considerations/Referrals:   Referral made to Heart Failure Pharmacist Stewardship: yes, appreciated Referral made to Heart Failure CSW/NCM TOC: yes, appreciated Referral made to Heart & Vascular TOC clinic: yes, Fri 2/24 @ 10AM  Items for Follow-up on DC/TOC: -optimize -PCP -insurance -medication cost/PTA -cont HF education  Ozella Rocks, MSN, RN Heart Failure Nurse Navigator [phone]

## 2021-07-09 NOTE — Discharge Summary (Signed)
Discharge summary dictated on 07/09/2021 dictation number is [id number]

## 2021-07-12 ENCOUNTER — Telehealth (HOSPITAL_COMMUNITY): Payer: Self-pay

## 2021-07-12 NOTE — Telephone Encounter (Signed)
Called and spoke with pt in regards to CR, pt stated she is interested. She does not have insurance at this time but would like to participate in our VCR program.

## 2021-07-16 ENCOUNTER — Other Ambulatory Visit: Payer: Self-pay

## 2021-07-16 ENCOUNTER — Ambulatory Visit (INDEPENDENT_AMBULATORY_CARE_PROVIDER_SITE_OTHER): Payer: Self-pay | Admitting: Nurse Practitioner

## 2021-07-16 ENCOUNTER — Encounter: Payer: Self-pay | Admitting: Nurse Practitioner

## 2021-07-16 VITALS — BP 122/78 | HR 80 | Temp 97.5°F | Ht 59.0 in | Wt 102.0 lb

## 2021-07-16 DIAGNOSIS — I214 Non-ST elevation (NSTEMI) myocardial infarction: Secondary | ICD-10-CM

## 2021-07-16 DIAGNOSIS — Z0000 Encounter for general adult medical examination without abnormal findings: Secondary | ICD-10-CM

## 2021-07-16 LAB — POCT URINALYSIS DIP (CLINITEK)
Bilirubin, UA: NEGATIVE
Blood, UA: NEGATIVE
Glucose, UA: NEGATIVE mg/dL
Ketones, POC UA: NEGATIVE mg/dL
Leukocytes, UA: NEGATIVE
Nitrite, UA: NEGATIVE
POC PROTEIN,UA: NEGATIVE
Spec Grav, UA: 1.01 (ref 1.010–1.025)
Urobilinogen, UA: 0.2 E.U./dL
pH, UA: 6 (ref 5.0–8.0)

## 2021-07-16 LAB — POCT GLYCOSYLATED HEMOGLOBIN (HGB A1C)
HbA1c POC (<> result, manual entry): 5 % (ref 4.0–5.6)
HbA1c, POC (controlled diabetic range): 5 % (ref 0.0–7.0)
HbA1c, POC (prediabetic range): 5 % — AB (ref 5.7–6.4)
Hemoglobin A1C: 5 % (ref 4.0–5.6)

## 2021-07-16 NOTE — Progress Notes (Deleted)
[facility] Patient [facility] [address] Anastasia Pall [place], [place]  [address] Phone:  [phone]   Fax:  [phone] Subjective:   Patient ID: Pamela Mcclain, female    DOB: [birth year], 61 y.o.   MRN: [id number]  No chief complaint on file.  HPI Samaa Mcclure 62 y.o. female  has a past medical history of COPD (chronic obstructive pulmonary disease) (HCC). Also endorses history of HIV. To the [facility] to establish care, last visit with PCP unknown.    Past Medical History:  Diagnosis Date   COPD (chronic obstructive pulmonary disease) (HCC)     *** The histories are not reviewed yet. Please review them in the "History" navigator section and refresh this SmartLink.  No family history on file.  Social History   Socioeconomic History   Marital status: Media planner    Spouse name: Felicie Morn   Number of children: 3   Years of education: Not on file   Highest education level: 10th grade  Occupational History   Occupation: self employed  Tobacco Use   Smoking status: Former    Packs/day: 1.00    Years: 38.00    Pack years: 38.00    Types: Cigarettes    Quit date: 05/26/2016    Years since quitting: 5.1   Smokeless tobacco: Never  Vaping Use   Vaping Use: Never used  Substance and Sexual Activity   Alcohol use: Not Currently   Drug use: Never   Sexual activity: Not on file  Other Topics Concern   Not on file  Social History Narrative   Not on file   Social Determinants of Health   Financial Resource Strain: Low Risk    Difficulty of Paying Living Expenses: Not very hard  Food Insecurity: No Food Insecurity   Worried About Programme researcher, broadcasting/film/video in the Last Year: Never true   Ran Out of Food in the Last Year: Never true  Transportation Needs: No Transportation Needs   Lack of Transportation (Medical): No   Lack of Transportation (Non-Medical): No  Physical Activity: Not on file  Stress: Not on file  Social Connections: Not on file  Intimate Partner Violence:  Not on file    Outpatient Medications Prior to Visit  Medication Sig Dispense Refill   albuterol (VENTOLIN HFA) 108 (90 Base) MCG/ACT inhaler Inhale 2 puffs into the lungs every 6 (six) hours as needed for wheezing or shortness of breath.     Ascorbic Acid (VITAMIN C PO) Take 1 tablet by mouth daily.     aspirin 81 MG EC tablet Take 1 tablet (81 mg total) by mouth daily. Swallow whole. 30 tablet 11   atorvastatin (LIPITOR) 80 MG tablet Take 1 tablet (80 mg total) by mouth daily. 90 tablet 3   budesonide-formoterol (SYMBICORT) 160-4.5 MCG/ACT inhaler Inhale 2 puffs into the lungs 2 (two) times daily.     carvedilol (COREG) 3.125 MG tablet Take 1 tablet (3.125 mg total) by mouth 2 (two) times daily with a meal. 180 tablet 3   Multiple Vitamins-Minerals (ZINC PO) Take 1 tablet by mouth daily.     sacubitril-valsartan (ENTRESTO) 24-26 MG Take 1 tablet by mouth 2 (two) times daily. 60 tablet 3   VITAMIN D PO Take 1 tablet by mouth daily.     VITAMIN E PO Take 1 tablet by mouth daily.     No facility-administered medications prior to visit.    No Known Allergies  ROS     Objective:  Physical Exam  There were no vitals taken for this visit. Wt Readings from Last 3 Encounters:  07/09/21 104 lb 0.9 oz (47.2 kg)     There is no immunization history on file for this patient.  Diabetic Foot Exam - Simple   No data filed     No results found for: TSH Lab Results  Component Value Date   WBC 6.0 07/08/2021   HGB 12.3 07/08/2021   HCT 38.3 07/08/2021   MCV 93.6 07/08/2021   PLT 220 07/08/2021   Lab Results  Component Value Date   NA 139 07/09/2021   K 4.2 07/09/2021   CO2 23 07/09/2021   GLUCOSE 114 (H) 07/09/2021   BUN 11 07/09/2021   CREATININE 0.84 07/09/2021   CALCIUM 9.0 07/09/2021   ANIONGAP 9 07/09/2021   Lab Results  Component Value Date   CHOL 202 (H) 07/08/2021   Lab Results  Component Value Date   HDL 57 07/08/2021   Lab Results  Component Value Date    LDLCALC 130 (H) 07/08/2021   Lab Results  Component Value Date   TRIG 77 07/08/2021   Lab Results  Component Value Date   CHOLHDL 3.5 07/08/2021   Lab Results  Component Value Date   HGBA1C 5.1 07/07/2021       Assessment & Plan:   Problem List Items Addressed This Visit   None   I am having Avni Nedrow maintain her budesonide-formoterol, albuterol, VITAMIN D PO, VITAMIN E PO, Multiple Vitamins-Minerals (ZINC PO), Ascorbic Acid (VITAMIN C PO), aspirin, atorvastatin, carvedilol, and sacubitril-valsartan.  No orders of the defined types were placed in this encounter.    Kathrynn Speed, NP

## 2021-07-16 NOTE — Patient Instructions (Signed)
You were seen today in the [facility] to establish care and for wellness visit. Continue taking previously prescribed medications. Please follow up in 6 mths  for reevaluation of chronic illness.

## 2021-07-16 NOTE — Progress Notes (Signed)
[facility] Patient [facility] [address] Pamela Mcclain [place], [place]  [address] Phone:  [phone]   Fax:  [phone] Subjective:   Patient ID: Pamela Mcclain, female    DOB: 19-Oct-1960, 61 y.o.   MRN: [id number]  Chief Complaint  Patient presents with   Establish Care    Pt is here for follow up visit from the hospital pt said she was diagnosed with acute NSTEMI.   HPI Pamela Mcclain 61 y.o. female  has a past medical history of COPD (chronic obstructive pulmonary disease) (HCC). To the [facility] to establish care after hospital admission.  States that she went to the ED on 07/07/21 for chest pain, back pain and shortness of breath. Most her symptoms have subsided after being discharged from the hospital. Was admitted to the for three days. Was evaluated and treated for continued shortness of breath yesterday, by cardiologist, now shortness of breath has improved after change in medications. Currently compliant with all medications.  States that she generally eats healthy and exercises regularly. Currently unemployed. Denies smoking cigarettes or consumption of drugs and/ alcohol. Denies any other complaints today.   Denies any fever. Denies any fatigue, chest pain, shortness of breath, HA or dizziness. Denies any blurred vision, numbness or tingling.  Past Medical History:  Diagnosis Date   COPD (chronic obstructive pulmonary disease) (HCC)       History reviewed. No pertinent family history.  Social History   Socioeconomic History   Marital status: Media planner    Spouse name: Felicie Morn   Number of children: 3   Years of education: Not on file   Highest education level: 10th grade  Occupational History   Occupation: self employed  Tobacco Use   Smoking status: Former    Packs/day: 1.00    Years: 38.00    Pack years: 38.00    Types: Cigarettes    Quit date: 05/26/2016    Years since quitting: 5.1   Smokeless tobacco: Never  Vaping Use   Vaping Use: Never used   Substance and Sexual Activity   Alcohol use: Not Currently   Drug use: Never   Sexual activity: Not on file  Other Topics Concern   Not on file  Social History Narrative   Not on file   Social Determinants of Health   Financial Resource Strain: Low Risk    Difficulty of Paying Living Expenses: Not very hard  Food Insecurity: No Food Insecurity   Worried About Programme researcher, broadcasting/film/video in the Last Year: Never true   Ran Out of Food in the Last Year: Never true  Transportation Needs: No Transportation Needs   Lack of Transportation (Medical): No   Lack of Transportation (Non-Medical): No  Physical Activity: Not on file  Stress: Not on file  Social Connections: Not on file  Intimate Partner Violence: Not on file    Outpatient Medications Prior to Visit  Medication Sig Dispense Refill   albuterol (VENTOLIN HFA) 108 (90 Base) MCG/ACT inhaler Inhale 2 puffs into the lungs every 6 (six) hours as needed for wheezing or shortness of breath.     Ascorbic Acid (VITAMIN C PO) Take 1 tablet by mouth daily.     aspirin 81 MG EC tablet Take 1 tablet (81 mg total) by mouth daily. Swallow whole. 30 tablet 11   atorvastatin (LIPITOR) 80 MG tablet Take 1 tablet (80 mg total) by mouth daily. 90 tablet 3   budesonide-formoterol (SYMBICORT) 160-4.5 MCG/ACT inhaler Inhale 2 puffs into  the lungs 2 (two) times daily.     carvedilol (COREG) 3.125 MG tablet Take 1 tablet (3.125 mg total) by mouth 2 (two) times daily with a meal. 180 tablet 3   Multiple Vitamins-Minerals (ZINC PO) Take 1 tablet by mouth daily.     sacubitril-valsartan (ENTRESTO) 24-26 MG Take 1 tablet by mouth 2 (two) times daily. 60 tablet 3   VITAMIN D PO Take 1 tablet by mouth daily.     VITAMIN E PO Take 1 tablet by mouth daily.     No facility-administered medications prior to visit.    No Known Allergies  Review of Systems  Constitutional:  Negative for chills, fever and malaise/fatigue.  HENT: Negative.    Eyes: Negative.    Respiratory:  Negative for cough and shortness of breath.   Cardiovascular:  Negative for chest pain, palpitations and leg swelling.  Gastrointestinal:  Negative for abdominal pain, blood in stool, constipation, diarrhea, nausea and vomiting.  Genitourinary: Negative.   Musculoskeletal: Negative.   Skin: Negative.   Neurological: Negative.   Psychiatric/Behavioral:  Negative for depression. The patient is not nervous/anxious.   All other systems reviewed and are negative.     Objective:    Physical Exam Vitals reviewed.  Constitutional:      General: She is not in acute distress.    Appearance: Normal appearance. She is normal weight.  HENT:     Head: Normocephalic.     Right Ear: Tympanic membrane, ear canal and external ear normal.     Left Ear: Tympanic membrane, ear canal and external ear normal.     Nose: Nose normal. No congestion or rhinorrhea.     Mouth/Throat:     Mouth: Mucous membranes are moist.     Pharynx: Oropharynx is clear. No oropharyngeal exudate or posterior oropharyngeal erythema.  Eyes:     General: No scleral icterus.       Right eye: No discharge.        Left eye: No discharge.     Conjunctiva/sclera: Conjunctivae normal.     Pupils: Pupils are equal, round, and reactive to light.  Neck:     Vascular: No carotid bruit.  Cardiovascular:     Rate and Rhythm: Normal rate and regular rhythm.     Pulses: Normal pulses.     Heart sounds: Normal heart sounds.     Comments: No obvious peripheral edema Pulmonary:     Effort: Pulmonary effort is normal.     Breath sounds: Normal breath sounds.  Abdominal:     General: Abdomen is flat. Bowel sounds are normal. There is no distension.     Palpations: Abdomen is soft. There is no mass.     Tenderness: There is no abdominal tenderness. There is no right CVA tenderness, left CVA tenderness, guarding or rebound.     Hernia: No hernia is present.  Musculoskeletal:        General: No swelling, tenderness,  deformity or signs of injury. Normal range of motion.     Cervical back: Normal range of motion and neck supple. No rigidity or tenderness.     Right lower leg: No edema.     Left lower leg: No edema.  Lymphadenopathy:     Cervical: No cervical adenopathy.  Skin:    General: Skin is warm and dry.     Capillary Refill: Capillary refill takes less than 2 seconds.  Neurological:     General: No focal deficit present.  Mental Status: She is alert and oriented to person, place, and time.  Psychiatric:        Mood and Affect: Mood normal.        Behavior: Behavior normal.        Thought Content: Thought content normal.        Judgment: Judgment normal.    BP 122/78    Pulse 80    Temp (!) 97.5 F (36.4 C)    Ht 4\' 11"  (1.499 m)    Wt 102 lb (46.3 kg)    SpO2 100%    BMI 20.60 kg/m  Wt Readings from Last 3 Encounters:  07/16/21 102 lb (46.3 kg)  07/09/21 104 lb 0.9 oz (47.2 kg)     There is no immunization history on file for this patient.  Diabetic Foot Exam - Simple   No data filed     No results found for: TSH Lab Results  Component Value Date   WBC 6.0 07/08/2021   HGB 12.3 07/08/2021   HCT 38.3 07/08/2021   MCV 93.6 07/08/2021   PLT 220 07/08/2021   Lab Results  Component Value Date   NA 139 07/09/2021   K 4.2 07/09/2021   CO2 23 07/09/2021   GLUCOSE 114 (H) 07/09/2021   BUN 11 07/09/2021   CREATININE 0.84 07/09/2021   CALCIUM 9.0 07/09/2021   ANIONGAP 9 07/09/2021   Lab Results  Component Value Date   CHOL 202 (H) 07/08/2021   Lab Results  Component Value Date   HDL 57 07/08/2021   Lab Results  Component Value Date   LDLCALC 130 (H) 07/08/2021   Lab Results  Component Value Date   TRIG 77 07/08/2021   Lab Results  Component Value Date   CHOLHDL 3.5 07/08/2021   Lab Results  Component Value Date   HGBA1C 5.0 07/16/2021   HGBA1C 5.0 07/16/2021   HGBA1C 5.0 (A) 07/16/2021   HGBA1C 5.0 07/16/2021       Assessment & Plan:   Problem  List Items Addressed This Visit       Cardiovascular and Mediastinum   Acute non-Q wave non-ST elevation myocardial infarction (NSTEMI) (HCC) Informed to continue taking medications as prescribed  Encouraged to maintain follow up appointment with specialist  Given anticipatory guidance    Other Visit Diagnoses     Health care maintenance    -  Primary   Relevant Orders   POCT glycosylated hemoglobin (Hb A1C) (Completed): 5.0   POCT URINALYSIS DIP (CLINITEK) (Completed) Encouraged continued diet and exercise efforts  Encouraged continued compliance with medication     Follow up in 6 mths for wellness visit, sooner as needed     I am having Twilla Haack maintain her budesonide-formoterol, albuterol, VITAMIN D PO, VITAMIN E PO, Multiple Vitamins-Minerals (ZINC PO), Ascorbic Acid (VITAMIN C PO), aspirin, atorvastatin, carvedilol, and sacubitril-valsartan.  No orders of the defined types were placed in this encounter.    Kathrynn Speed, NP

## 2021-07-18 NOTE — Progress Notes (Signed)
HEART & VASCULAR TRANSITION OF CARE CONSULT NOTE     Referring Physician: Dr. Terrence Dupont  Primary Care: Bo Merino I, NP  Primary Cardiologist: Dr. Terrence Dupont   HPI: Referred to clinic by Dr. Ophelia Charter for heart failure consultation.   61 y/o female w/ h/o tobacco use (quit 61 y/o) and COPD recently admitted to [facility] w/ Chest pain. EKG done in the ED showed normal sinus rhythm with Q-waves in V1-V3 suggestive of anterior MI. Hs trop peaked to 2,909. Echo showed severely reduced LVEF, 25-30%. RV normal. No recent viral illness. LHC showed minimal single vessel CAD w/ only 30% p-mid RCA stenosis. Diagnosed w/ Takotsubo syndrome. Placed on medical therapy, Entresto and Coreg. ASA and statin for mild CAD.   She had post hospital f/u w/ Dr. Terrence Dupont 2 days ago and he added 12.5 mg of spironolactone. Labs were ok.   She presents to [facility] clinic today for f/u. Here w/ her partner of 20 years. Reports wt has been stable at home post discharge but c/w exertional dyspnea, NYHA Class II. No resting dyspnea. Denies orthopnea/PND. No LEE. BP is soft, 96/68 but no orthostatic symptoms. She looks euvolemic on exam. Unable to get ReDs clip (low quality) but reviewed hospital labs and BNP was normal at 39.   No palpitations. Denies syncope/ near syncope.    Cardiac Testing  Left ventricular ejection fraction, by estimation, is 25 to 30%. The left ventricle has severely decreased function. The left ventricle demonstrates regional wall motion abnormalities (see scoring diagram/findings for description). The left ventricular internal cavity size was moderately dilated. Left ventricular diastolic function could not be evaluated. 1. Right ventricular systolic function is normal. The right ventricular size is normal. There is normal pulmonary artery systolic pressure. 2. 3. The mitral valve is normal in structure. Trivial mitral valve regurgitation. 4. The aortic valve is normal in structure. Aortic valve  regurgitation is not visualized. The inferior vena cava is normal in size with <50% respiratory variability, suggesting right atrial pressure of 8 mmHg.    LHC  Prox RCA to Mid RCA lesion is 30% stenosed.   There is severe left ventricular systolic dysfunction.   The left ventricular ejection fraction is 25-35% by visual estimate.   Review of Systems: [y] = yes, [ ]  = no   General: Weight gain [ ] ; Weight loss [ ] ; Anorexia [ ] ; Fatigue [ ] ; Fever [ ] ; Chills [ ] ; Weakness [ ]   Cardiac: Chest pain/pressure [ ] ; Resting SOB [ ] ; Exertional SOB [Y ]; Orthopnea [ ] ; Pedal Edema [ ] ; Palpitations [ ] ; Syncope [ ] ; Presyncope [ ] ; Paroxysmal nocturnal dyspnea[ ]   Pulmonary: Cough [ ] ; Wheezing[ ] ; Hemoptysis[ ] ; Sputum [ ] ; Snoring [ ]   GI: Vomiting[ ] ; Dysphagia[ ] ; Melena[ ] ; Hematochezia [ ] ; Heartburn[ ] ; Abdominal pain [ ] ; Constipation [ ] ; Diarrhea [ ] ; BRBPR [ ]   GU: Hematuria[ ] ; Dysuria [ ] ; Nocturia[ ]   Vascular: Pain in legs with walking [ ] ; Pain in feet with lying flat [ ] ; Non-healing sores [ ] ; Stroke [ ] ; TIA [ ] ; Slurred speech [ ] ;  Neuro: Headaches[ ] ; Vertigo[ ] ; Seizures[ ] ; Paresthesias[ ] ;Blurred vision [ ] ; Diplopia [ ] ; Vision changes [ ]   Ortho/Skin: Arthritis [ ] ; Joint pain [ ] ; Muscle pain [ ] ; Joint swelling [ ] ; Back Pain [ ] ; Rash [ ]   Psych: Depression[ ] ; Anxiety[ ]   Heme: Bleeding problems [ ] ; Clotting disorders [ ] ; Anemia [ ]   Endocrine: Diabetes [ ] ; Thyroid dysfunction[ ]    Past Medical History:  Diagnosis Date   COPD (chronic obstructive pulmonary disease) (HCC)     Current Outpatient Medications  Medication Sig Dispense Refill   albuterol (VENTOLIN HFA) 108 (90 Base) MCG/ACT inhaler Inhale 2 puffs into the lungs every 6 (six) hours as needed for wheezing or shortness of breath.     Ascorbic Acid (VITAMIN C PO) Take 1 tablet by mouth daily.     aspirin 81 MG EC tablet Take 1 tablet (81 mg total) by mouth daily. Swallow whole. 30 tablet 11    atorvastatin (LIPITOR) 80 MG tablet Take 1 tablet (80 mg total) by mouth daily. 90 tablet 3   budesonide-formoterol (SYMBICORT) 160-4.5 MCG/ACT inhaler Inhale 2 puffs into the lungs 2 (two) times daily.     carvedilol (COREG) 3.125 MG tablet Take 1 tablet (3.125 mg total) by mouth 2 (two) times daily with a meal. 180 tablet 3   Multiple Vitamins-Minerals (ZINC PO) Take 1 tablet by mouth daily.     sacubitril-valsartan (ENTRESTO) 24-26 MG Take 1 tablet by mouth 2 (two) times daily. 60 tablet 3   spironolactone (ALDACTONE) 25 MG tablet Take 12.5 mg by mouth daily.     VITAMIN D PO Take 1 tablet by mouth daily.     VITAMIN E PO Take 1 tablet by mouth daily.     No current facility-administered medications for this encounter.    No Known Allergies    Social History   Socioeconomic History   Marital status: Soil scientist    Spouse name: Etta Quill   Number of children: 3   Years of education: Not on file   Highest education level: 10th grade  Occupational History   Occupation: self employed  Tobacco Use   Smoking status: Former    Packs/day: 1.00    Years: 38.00    Pack years: 38.00    Types: Cigarettes    Quit date: 05/26/2016    Years since quitting: 5.1   Smokeless tobacco: Never  Vaping Use   Vaping Use: Never used  Substance and Sexual Activity   Alcohol use: Not Currently   Drug use: Never   Sexual activity: Not on file  Other Topics Concern   Not on file  Social History Narrative   Not on file   Social Determinants of Health   Financial Resource Strain: Low Risk    Difficulty of Paying Living Expenses: Not very hard  Food Insecurity: No Food Insecurity   Worried About Charity fundraiser in the Last Year: Never true   [place] in the Last Year: Never true  Transportation Needs: No Transportation Needs   Lack of Transportation (Medical): No   Lack of Transportation (Non-Medical): No  Physical Activity: Not on file  Stress: Not on file  Social  Connections: Not on file  Intimate Partner Violence: Not on file     No family history on file.  Vitals:   07/19/21 0949  BP: 96/68  Pulse: 65  SpO2: 99%  Weight: 46.3 kg (102 lb 2 oz)    PHYSICAL EXAM: ReDs Clip: unable to get reading (low quality)  General:  Well appearing, thin WF. No respiratory difficulty HEENT: normal Neck: supple. no JVD. Carotids 2+ bilat; no bruits. No lymphadenopathy or thryomegaly appreciated. Cor: PMI nondisplaced. Regular rate & rhythm. No rubs, gallops or murmurs. Lungs: clear Abdomen: soft, nontender, nondistended. No hepatosplenomegaly. No bruits or masses. Good bowel sounds.  Extremities: no cyanosis, clubbing, rash, edema Neuro: alert & oriented x 3, cranial nerves grossly intact. moves all 4 extremities w/o difficulty. Affect pleasant.  ECG: not performed    ASSESSMENT & PLAN:  1. Systolic Heart Failure/ Takotsubo CM - Echo EF 25-30%, RV normal. HS trop peaked 2,900. LHC w/ minimal nonob CAD. No recent viral illnesses  - NYHA Class II. Euvolemic on exam - Continue Entresto 24-26 mg bid - Continue Spiro 12.5 mg daily  - Continue Coreg 3.125 mg bid  - BP too soft for further med titration  - No need for loop diuretic currently (BNP 39)  - holding SGLT2i initiation for now given risk for volume depletion  - BMP recently checked and followed by Dr. Ophelia Charter  - Discussed continuation of daily wts and fluid restriction  - Plan to continue f/u w/ Dr. Terrence Dupont.  - He will plan repeat echo. If EF not improving, consider cMRI and referral to the [facility]   2. CAD - minimal disease on cath - on ASA,statin +? blocker - will be followed by cardiology   3. COPD - stable, former smoker   NYHA II-III GDMT  Diuretic- no loop diuretic requirements  BB- Coreg 3.125 mg bid Ace/ARB/ARNI Entresto 24-26 mg bid  MRA Spiro 12.5 mg daily  SGLT2i -not yet, risk for volume depletion     Referred to HFSW (PCP, Medications, Transportation, ETOH Abuse,  Drug Abuse, Insurance, Financial ): Yes or No Refer to Pharmacy: No  Refer to Home Health:  No Refer to Advanced Heart Failure Clinic: No  Refer to General Cardiology: Yes (Dr. Ophelia Charter)   Follow up  as planned w/ Dr. Ophelia Charter

## 2021-07-19 ENCOUNTER — Other Ambulatory Visit: Payer: Self-pay

## 2021-07-19 ENCOUNTER — Ambulatory Visit (HOSPITAL_COMMUNITY)
Admit: 2021-07-19 | Discharge: 2021-07-19 | Disposition: A | Discharge status: Home / Self Care | Payer: Self-pay | Attending: Cardiology | Admitting: Cardiology

## 2021-07-19 ENCOUNTER — Encounter (HOSPITAL_COMMUNITY): Payer: Self-pay

## 2021-07-19 VITALS — BP 96/68 | HR 65 | Wt 102.1 lb

## 2021-07-19 DIAGNOSIS — Z79899 Other long term (current) drug therapy: Secondary | ICD-10-CM | POA: Insufficient documentation

## 2021-07-19 DIAGNOSIS — I5020 Unspecified systolic (congestive) heart failure: Secondary | ICD-10-CM | POA: Insufficient documentation

## 2021-07-19 DIAGNOSIS — Z09 Encounter for follow-up examination after completed treatment for conditions other than malignant neoplasm: Secondary | ICD-10-CM | POA: Insufficient documentation

## 2021-07-19 DIAGNOSIS — I5022 Chronic systolic (congestive) heart failure: Secondary | ICD-10-CM

## 2021-07-19 DIAGNOSIS — R0600 Dyspnea, unspecified: Secondary | ICD-10-CM | POA: Insufficient documentation

## 2021-07-19 DIAGNOSIS — J449 Chronic obstructive pulmonary disease, unspecified: Secondary | ICD-10-CM | POA: Insufficient documentation

## 2021-07-19 DIAGNOSIS — I5181 Takotsubo syndrome: Secondary | ICD-10-CM | POA: Insufficient documentation

## 2021-07-19 DIAGNOSIS — Z87891 Personal history of nicotine dependence: Secondary | ICD-10-CM | POA: Insufficient documentation

## 2021-07-19 DIAGNOSIS — Z7901 Long term (current) use of anticoagulants: Secondary | ICD-10-CM | POA: Insufficient documentation

## 2021-07-19 DIAGNOSIS — I2510 Atherosclerotic heart disease of native coronary artery without angina pectoris: Secondary | ICD-10-CM | POA: Insufficient documentation

## 2021-07-19 DIAGNOSIS — Z7982 Long term (current) use of aspirin: Secondary | ICD-10-CM | POA: Insufficient documentation

## 2021-07-19 NOTE — Progress Notes (Signed)
Heart and Vascular Care Navigation  07/19/2021  Pamela Mcclain [birth year] [id number]  Reason for Referral: CSW referred to speak with pt regarding current lack of insurance.   Engaged with patient face to face for initial visit for Heart and Vascular Care Coordination.                                                                                                   Assessment:  CSW met with pt and pt significant other to discuss current lack of insurance.  States pt has not had insurance since she worked for [employer] about 7 years ago.  Has not applied for Medicaid or disability- reviewed by [facility] in the hospital and not deemed to met criteria for a long term disability.  Informed pt if she thinks she will be unable to return to work she can apply for medicaid/disability independently but her plan at this time is to return to work.    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      CSW discussed alternative options for insurance.  Provided with packet about ACA insurance as she might qualify for special enrollment due to recent hospital stay.  Also provided with [facility] Financial Assistance application to help with past and current [facility] bills.    Pt will not be a patient with our clinic so does not qualify for Heart Failure fund to get medications so provided with [place] [facility] application.  Pt already has [employer] application to help get entresto and is working on it.                            HRT/VAS Care Coordination     Patients Home Cardiology Office --  Dr. Terrence Dupont   Living arrangements for the past 2 months Single Family Home   Lives with: Significant Other   Patient Current Insurance Coverage Self-Pay   Patient Has Concern With Paying Medical Bills Yes   Medical Bill Referrals: CAFA   Does Patient Have Prescription Coverage? No   Patient Prescription Assistance Programs [place] [facility];  Patient Assistance Programs   Home Assistive Devices/Equipment None   DME Agency NA       Social History:                                                                             SDOH Screenings   Alcohol Screen: Low Risk    Last Alcohol Screening Score (AUDIT): 0  Depression (PHQ2-9): Medium Risk   PHQ-2 Score: 5  Financial Resource Strain: Low Risk    Difficulty of Paying Living Expenses: Not very hard  Food Insecurity: No Food Insecurity   Worried About Charity fundraiser in the  Last Year: Never true   Ran Out of Food in the Last Year: Never true  Housing: Low Risk    Last Housing Risk Score: 0  Physical Activity: Not on file  Social Connections: Not on file  Stress: Not on file  Tobacco Use: Medium Risk   Smoking Tobacco Use: Former   Smokeless Tobacco Use: Never   Passive Exposure: Not on file  Transportation Needs: No Transportation Needs   Lack of Transportation (Medical): No   Lack of Transportation (Non-Medical): No    SDOH Interventions: Financial Resources:    Pt not working currently but 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  Will call CSW if they have any questions  Pamela Mcclain, [facility] Desk#: [phone] Cell#: [phone]

## 2021-07-19 NOTE — Progress Notes (Signed)
HF education reinforced. Pt given Entresto samples, she has [employer] pt assist application and will take it to Dr Annitta Jersey office for them to complete.  Medication Samples have been provided to the patient.  Drug name: Sherryll Burger       Strength: 24/26mg         Qty: 2  LOT: [id number]  Exp.Date: 2/25  Dosing instructions: Take 1 tab Twice daily   The patient has been instructed regarding the correct time, dose, and frequency of taking this medication, including desired effects and most common side effects.   Howie Rufus 10:31 AM 07/19/2021

## 2021-07-19 NOTE — Patient Instructions (Signed)
Continue current medications, no changes at this time  We have provided you with samples of Entresto, please take the [employer] patient assistance application to Dr Annitta Jersey office for them to complete for you.  Do the following things EVERYDAY: Weigh yourself in the morning before breakfast. Write it down and keep it in a log. Take your medicines as prescribed Eat low salt foods--Limit salt (sodium) to 2000 mg per day.  Stay as active as you can everyday Limit all fluids for the day to less than 2 liters  Thank you for allowing [place] to provider your heart failure care after your recent hospitalization. Please follow-up with Dr Sharyn Lull as scheduled

## 2021-08-08 ENCOUNTER — Other Ambulatory Visit (HOSPITAL_COMMUNITY): Payer: Self-pay

## 2021-08-15 ENCOUNTER — Telehealth (HOSPITAL_COMMUNITY): Payer: Self-pay

## 2021-08-15 ENCOUNTER — Encounter (HOSPITAL_COMMUNITY): Payer: Self-pay

## 2021-08-15 NOTE — Telephone Encounter (Signed)
Attempted to call patient in regards to Cardiac Rehab - LM on VM Mailed letter 

## 2021-09-10 ENCOUNTER — Telehealth (HOSPITAL_COMMUNITY): Payer: Self-pay

## 2021-09-10 NOTE — Telephone Encounter (Signed)
No response from pt regarding CR.  Closed referral.  

## 2022-01-02 ENCOUNTER — Encounter: Payer: Self-pay | Admitting: Pulmonary Disease

## 2022-01-02 ENCOUNTER — Ambulatory Visit (INDEPENDENT_AMBULATORY_CARE_PROVIDER_SITE_OTHER): Payer: Self-pay | Admitting: Pulmonary Disease

## 2022-01-02 VITALS — BP 136/72 | HR 83 | Ht 61.0 in | Wt 93.6 lb

## 2022-01-02 DIAGNOSIS — J449 Chronic obstructive pulmonary disease, unspecified: Secondary | ICD-10-CM

## 2022-01-02 DIAGNOSIS — I5181 Takotsubo syndrome: Secondary | ICD-10-CM

## 2022-01-02 NOTE — Progress Notes (Signed)
@Patient  ID: , female    DOB: Sep 04, 1960, 61 y.o.   MRN: [id number]  Chief Complaint  Patient presents with   Consult    Pt is here for consutl for copd. Pt states that she has had COPD for 4-5 years. Pt is severaly SOB. She states she can not talk for long without running out of brath. Pt states her breathing got worse after her heart attack. Pt states she had her heart attack Feb 12,23. Echo and cath done Feb,2023. Pt is currently on Albuterol as needed. Pt was on Symbicort before her heart attack but has not resumed it. Pt states Symbicort did help with her breathing.     Referring provider: 03-07-1978, NP  HPI:   61 y.o. whom are seen in consultation for evaluation of dyspnea on exertion and COPD.  Note from referring provider reviewed.  Most recent cardiology note reviewed.  Discharge summary 06/2021 reviewed.  Patient diagnosed with COPD about 5 years ago.  This was in [facility].  At the time there is concern for cardiac contributors dyspnea.  She had a left heart cath that was reportedly clear.  At that time cardiology referred her to with sound like pulmonary.  PFTs personally spirometry was obtained.  She was told she had COPD.  Has been maintained on Symbicort most recently.  Only using 1 puff twice a day.  Not really very effective.  Frequent albuterol use throughout the day.  Symptoms a bit worse since moving to [facility] from [place].  In general requires a round of prednisone a year although last one was almost 2 years ago.  Reviewed most recent chest x-ray 06/2021 reveals hyperinflated lungs and flattened diaphragms.  Her left heart catheterization 06/2021 demonstrated nonobstructive coronary disease.  She had a TTE at the same time that showed severely reduced EF felt to be related to Takotsubo's.  She has not followed up with cardiology reliably.  She stopped taking her evidence-based medicines some months ago.  She said they are making her feel tired,  could not get out of bed etc.  No appetite.  Energy has improved since stopping these medications.  PMH: Tobacco abuse in remission, stress-induced cardiomyopathy Surgical history: Reviewed, denies significant surgical history Family history:History reviewed. No pertinent family history. Social history: Former smoker, 76-pack-year, quit 2018, lives in [place] / Pulmonary Flowsheets:   ACT:      No data to display          MMRC:     No data to display          Epworth:      No data to display          Tests:   FENO:  No results found for: "NITRICOXIDE"  PFT:     No data to display          WALK:      No data to display          Imaging: Personally reviewed and as per EMR discussion this note No results found.  Lab Results: Personally reviewed CBC    Component Value Date/Time   WBC 6.0 07/08/2021 0520   RBC 4.09 07/08/2021 0520   HGB 12.3 07/08/2021 0520   HCT 38.3 07/08/2021 0520   PLT 220 07/08/2021 0520   MCV 93.6 07/08/2021 0520   MCH 30.1 07/08/2021 0520   MCHC 32.1 07/08/2021 0520   RDW 12.6 07/08/2021 0520    BMET  Component Value Date/Time   NA 139 07/09/2021 0127   K 4.2 07/09/2021 0127   CL 107 07/09/2021 0127   CO2 23 07/09/2021 0127   GLUCOSE 114 (H) 07/09/2021 0127   BUN 11 07/09/2021 0127   CREATININE 0.84 07/09/2021 0127   CALCIUM 9.0 07/09/2021 0127   GFRNONAA >60 07/09/2021 0127    BNP    Component Value Date/Time   BNP 39.6 07/07/2021 0934    ProBNP No results found for: "PROBNP"  Specialty Problems   None   No Known Allergies   There is no immunization history on file for this patient.  Past Medical History:  Diagnosis Date   COPD (chronic obstructive pulmonary disease) (HCC)     Tobacco History: Social History   Tobacco Use  Smoking Status Former   Packs/day: 1.00   Years: 38.00   Total pack years: 38.00   Types: Cigarettes   Quit date: 05/26/2016   Years since  quitting: 5.6  Smokeless Tobacco Never   Counseling given: Not Answered   Continue to not smoke  Outpatient Encounter Medications as of 01/02/2022  Medication Sig   albuterol (VENTOLIN HFA) 108 (90 Base) MCG/ACT inhaler Inhale 2 puffs into the lungs every 6 (six) hours as needed for wheezing or shortness of breath.   Ascorbic Acid (VITAMIN C PO) Take 1 tablet by mouth daily.   aspirin 81 MG EC tablet Take 1 tablet (81 mg total) by mouth daily. Swallow whole.   carvedilol (COREG) 3.125 MG tablet Take 1 tablet (3.125 mg total) by mouth 2 (two) times daily with a meal.   Multiple Vitamins-Minerals (ZINC PO) Take 1 tablet by mouth daily.   sertraline (ZOLOFT) 25 MG tablet Take 25 mg by mouth daily.   VITAMIN D PO Take 1 tablet by mouth daily.   VITAMIN E PO Take 1 tablet by mouth daily.   [DISCONTINUED] atorvastatin (LIPITOR) 80 MG tablet Take 1 tablet (80 mg total) by mouth daily.   [DISCONTINUED] sacubitril-valsartan (ENTRESTO) 24-26 MG Take 1 tablet by mouth 2 (two) times daily.   [DISCONTINUED] spironolactone (ALDACTONE) 25 MG tablet Take 12.5 mg by mouth daily.   budesonide-formoterol (SYMBICORT) 160-4.5 MCG/ACT inhaler Inhale 2 puffs into the lungs 2 (two) times daily. (Patient not taking: Reported on 01/02/2022)   No facility-administered encounter medications on file as of 01/02/2022.     Review of Systems  Review of Systems  No chest with exertion.  No orthopnea or PND.  Comprehensive review of systems otherwise negative. Physical Exam  BP 136/72 (BP Location: Left Arm, Patient Position: Sitting, Cuff Size: Normal)   Pulse 83   Ht 5\' 1"  (1.549 m)   Wt 93 lb 9.6 oz (42.5 kg)   SpO2 96%   BMI 17.69 kg/m   Wt Readings from Last 5 Encounters:  01/02/22 93 lb 9.6 oz (42.5 kg)  07/19/21 102 lb 2 oz (46.3 kg)  07/16/21 102 lb (46.3 kg)  07/09/21 104 lb 0.9 oz (47.2 kg)    BMI Readings from Last 5 Encounters:  01/02/22 17.69 kg/m  07/19/21 20.63 kg/m  07/16/21 20.60  kg/m  07/09/21 19.66 kg/m     Physical Exam General: Sitting in chair, no acute distress Eyes: EOMI, no icterus Neck: Supple, no JVP Pulmonary: Clear, distant, normal work of breathing Cardiovascular: No edema, warm Abdomen: No significant bowel sounds present MSK: No synovitis, no joint effusion Neuro: Normal gait, no weakness Psych: Normal mood, full affect   Assessment & Plan:   COPD:  Gold D based on frequent exacerbations.  Diagnosis based on PFTs performed in [facility] a few years ago.  Given burden of symptoms and frequent exacerbations, escalate Symbicort to [place].  Samples today, applying for manufacturing assistance today.  Dyspnea on exertion: Suspect related to COPD.  Improves with albuterol, bronchodilators.  Has stress-induced cardiomyopathy 06/2021 without adequate follow-up.  She stopped evidence-based medicines.  Possible cardiomyopathy is ongoing and contributing but would expect this to have improved.  Encouraged her to follow-up with cardiology.  Referral to different group today for ongoing evaluation of follow-up.   Return in about 3 months (around 04/04/2022).   Karren Burly, MD 01/02/2022

## 2022-01-02 NOTE — Patient Instructions (Addendum)
Nice to meet you  I think we should be aggressive with inhalers to treat your COPD given your symptoms but as well as your need for prednisone in the past.  Use Breztri 2 puffs twice a day every day.  Rinse your mouth out with water after every use.  I am hopeful this will help some since the albuterol seems to help.  This as a third medication to help dilate or open up the airways.  I hope this helps you breathe better.  Continue use albuterol as needed.  I provided samples of the [facility] today, thank you for filling out paperwork to see if we can get assistance from the manufacturer to help decrease the cost.  I sent a referral to our cardiology doctors in the [facility] health group.  This is in regards to the Takotsubo's or broken heart you experienced earlier in the year.  Return to clinic in 3 months or sooner as needed with Dr. Judeth Horn

## 2022-01-13 ENCOUNTER — Ambulatory Visit: Payer: Self-pay | Admitting: Nurse Practitioner

## 2022-01-13 ENCOUNTER — Other Ambulatory Visit (HOSPITAL_COMMUNITY): Payer: Self-pay

## 2022-01-13 ENCOUNTER — Encounter (INDEPENDENT_AMBULATORY_CARE_PROVIDER_SITE_OTHER): Payer: Self-pay | Admitting: Nurse Practitioner

## 2022-01-31 ENCOUNTER — Telehealth: Payer: Self-pay | Admitting: Pulmonary Disease

## 2022-01-31 NOTE — Telephone Encounter (Signed)
Patient states that she was given a sample of Breztri while she was trying to get approved for assistance. Patient states she took the last 2 puffs of Breztri this morning and wants to know if she needs to go back on symbicort until she can get approved. Patient states Symbicort helps but not as much as the [employer].  Please advise, call back at [phone].

## 2022-01-31 NOTE — Telephone Encounter (Signed)
Patient states that she was given a sample of Breztri while she was trying to get approved for assistance. Patient states she took the last 2 puffs of Breztri this morning and wants to know if she needs to go back on symbicort until she can get approved. Patient states Symbicort helps but not as much as the [employer].  Do you want me to give her more samples of Breztri till we hear back from Patient Assistance?  Please advise sir

## 2022-01-31 NOTE — Telephone Encounter (Signed)
Yes - ok for samples and assistance paperwork.

## 2022-01-31 NOTE — Telephone Encounter (Signed)
Called patient back and informed her that MH wants her to stay on Breztri. And that I will have 2 samples of Breztri upfront for her. She will pick them up today. Nothing further needed

## 2022-02-10 ENCOUNTER — Ambulatory Visit: Payer: Self-pay | Admitting: Interventional Cardiology

## 2022-02-17 ENCOUNTER — Telehealth: Payer: Self-pay | Admitting: Pulmonary Disease

## 2022-02-18 MED ORDER — BREZTRI AEROSPHERE 160-9-4.8 MCG/ACT IN AERO: 2.0000 | INHALATION_SPRAY | Freq: Two times a day (BID) | RESPIRATORY_TRACT | 0 refills | Status: DC

## 2022-02-18 NOTE — Telephone Encounter (Signed)
Called and spoke with patient.  Patient is waiting on assistance decision and requested Breztri samples.  Breztri samples placed at front desk for pick up.  Nothing further at this time.

## 2022-02-19 NOTE — Progress Notes (Signed)
Erroneous encounter

## 2022-03-26 ENCOUNTER — Telehealth: Payer: Self-pay | Admitting: Pulmonary Disease

## 2022-03-26 ENCOUNTER — Ambulatory Visit (HOSPITAL_COMMUNITY)
Admission: RE | Admit: 2022-03-26 | Discharge: 2022-03-26 | Disposition: A | Discharge status: Home / Self Care | Payer: Self-pay | Source: Ambulatory Visit | Attending: Pulmonary Disease | Admitting: Pulmonary Disease

## 2022-03-26 DIAGNOSIS — R0989 Other specified symptoms and signs involving the circulatory and respiratory systems: Secondary | ICD-10-CM | POA: Insufficient documentation

## 2022-03-26 MED ORDER — IOHEXOL 350 MG/ML SOLN
60.0000 mL | Freq: Once | INTRAVENOUS | Status: AC | PRN
  Administered 2022-03-26: 60 mL via INTRAVENOUS

## 2022-03-26 NOTE — Telephone Encounter (Signed)
Noted  

## 2022-03-26 NOTE — Telephone Encounter (Signed)
Pt called stating that she has been taking the Breztri that she was given a sample of. States that she has been having chest pains which will wake her up in the middle of the night. Pt describes this as pains similar to when she had a heart attack but states that she knows it was not a heart attack happening.  Pt said she has been having tightness in her chest after taking it and said that she is not able to walk as far as she could prior to taking the [place].  Pt said that she did get covid again about a month ago.  Pt has had to use her rescue inhaler at least three times daily which she said she had not had to use any prior to starting the [place].  Due to all this, pt wants to know what might be able to be recommended. Dr. Silas Flood, please advise.

## 2022-03-26 NOTE — Telephone Encounter (Signed)
Pt has been scheduled for this evening at 6:30.  Spoke to her and gave her appt info.  Nothing further needed.

## 2022-03-26 NOTE — Telephone Encounter (Signed)
Called listed phone number connected over the phone with patient.  She describes 7 to 10 days of chest pain.  Really starts in the arm and radiates to the chest and up the neck.  Worse at night when lying down.  Not reliably reproduced with exertion.  She was on Breztri for 2 months before the symptoms started.  She been off Breztri for 2 days and symptoms are still occurring.  Was so bad earlier today that she almost went to the emergency room but symptoms eventually let off.  I advised the based on her symptoms it sounds like coronary issue and advised emergency room evaluation.  She states that does not feel like her prior heart attack.  Notably she did have a left heart cath 06/2021 only showed a 25 to 30% stenosis.  In addition, she was diagnosed with COVID about a month ago.  Discussed that given her description of symptoms worry about post-COVID pulmonary embolus.  Discussed the need for a CTA PE protocol given her aversion to go to the ED at this time.  If this is negative she will need urgent cardiac evaluation which I think can only be performed at the hospital.  CTA PE protocol was ordered stat.  [facility] pool copied for assistance with scheduling.

## 2022-03-27 ENCOUNTER — Telehealth: Payer: Self-pay | Admitting: Pulmonary Disease

## 2022-03-27 MED ORDER — BUDESONIDE-FORMOTEROL FUMARATE 160-4.5 MCG/ACT IN AERO: 2.0000 | INHALATION_SPRAY | Freq: Two times a day (BID) | RESPIRATORY_TRACT | 11 refills | Status: DC

## 2022-03-27 NOTE — Telephone Encounter (Signed)
Connected with patient via phone regarding results of CT scan.  No evidence of blood clot or pulmonary embolus.  Lung parenchyma looks healthy as well.  No pneumonia or other explanation for pain.  Recommend PCP evaluation versus going to emergency room for evaluation of cardiac issues if symptoms worsen or do not improve in the coming day or 2.  She expressed understanding.  She request refill Symbicort given some mild dyspnea on exertion.  This was refilled today.  Notably Breztri to expensive in the past.

## 2022-04-07 ENCOUNTER — Ambulatory Visit (INDEPENDENT_AMBULATORY_CARE_PROVIDER_SITE_OTHER): Payer: Self-pay | Admitting: Pulmonary Disease

## 2022-04-07 ENCOUNTER — Encounter: Payer: Self-pay | Admitting: Pulmonary Disease

## 2022-04-07 VITALS — BP 142/76 | HR 60 | Temp 98.5°F | Wt 105.0 lb

## 2022-04-07 DIAGNOSIS — R0609 Other forms of dyspnea: Secondary | ICD-10-CM

## 2022-04-07 DIAGNOSIS — G8929 Other chronic pain: Secondary | ICD-10-CM

## 2022-04-07 DIAGNOSIS — M25512 Pain in left shoulder: Secondary | ICD-10-CM

## 2022-04-07 DIAGNOSIS — J432 Centrilobular emphysema: Secondary | ICD-10-CM

## 2022-04-07 NOTE — Progress Notes (Signed)
@Patient  ID: Pamela Mcclain, female    DOB: 24-May-1961, 61 y.o.   MRN: [id number]  Chief Complaint  Patient presents with   Follow-up    Pt is here for follow up for COPD. Pt states that she is unable to afford the Symbicort. Will need to check on Breztri pt assistance. She states she is wanting to talk about an alternative. She states that she is still having issues with her breathing but Markus Daft worked really well for her. CT Angio done 03/26/2022    Referring provider: Ivonne Andrew, NP  HPI:   61 y.o. whom are seen in follow up for evaluation of dyspnea on exertion and COPD.  Multiple telephone encounters office in the interim since last visit reviewed.  She contacted me within the last 2 to 3 weeks.  Complained of new onset or worsened left arm pain pain radiating to neck and chest.  CTA PE protocol was negative.  Advised to go to ED for cardiac evaluation.  She declined.  Today she continues to complain of left-sided shoulder pain.  When described it sounds more MSK in nature.  Worse with certain positions.  Started after a stretch injury it sounds like to the shoulder area.  On exam she has signs of rotator cuff inflammation or pain.  No frank weakness.  In terms of breathing, things are stable.  On Symbicort.  Somewhat better in terms of baseline symptoms but not as good as on the [place].  The Markus Daft is too expensive.  She has not heard back from manufacturing assistance.  HPI at initial visit:  Patient diagnosed with COPD about 5 years ago.  This was in [facility].  At the time there is concern for cardiac contributors dyspnea.  She had a left heart cath that was reportedly clear.  At that time cardiology referred her to with sound like pulmonary.  PFTs personally spirometry was obtained.  She was told she had COPD.  Has been maintained on Symbicort most recently.  Only using 1 puff twice a day.  Not really very effective.  Frequent albuterol use throughout the day.  Symptoms a bit  worse since moving to [facility] from [place].  In general requires a round of prednisone a year although last one was almost 2 years ago.  Reviewed most recent chest x-ray 06/2021 reveals hyperinflated lungs and flattened diaphragms.  Her left heart catheterization 06/2021 demonstrated nonobstructive coronary disease.  She had a TTE at the same time that showed severely reduced EF felt to be related to Takotsubo's.  She has not followed up with cardiology reliably.  She stopped taking her evidence-based medicines some months ago.  She said they are making her feel tired, could not get out of bed etc.  No appetite.  Energy has improved since stopping these medications.  PMH: Tobacco abuse in remission, stress-induced cardiomyopathy Surgical history: Reviewed, denies significant surgical history Family history:History reviewed. No pertinent family history. Social history: Former smoker, 76-pack-year, quit 2018, lives in [place] / Pulmonary Flowsheets:   ACT:      No data to display          MMRC:     No data to display          Epworth:      No data to display          Tests:   FENO:  No results found for: "NITRICOXIDE"  PFT:     No data to display  WALK:      No data to display          Imaging: Personally reviewed and as per EMR discussion this note CT Angio Chest Pulmonary Embolism (PE) W or WO Contrast  Result Date: 03/26/2022 CLINICAL DATA:  Chest pain EXAM: CT ANGIOGRAPHY CHEST WITH CONTRAST TECHNIQUE: Multidetector CT imaging of the chest was performed using the standard protocol during bolus administration of intravenous contrast. Multiplanar CT image reconstructions and MIPs were obtained to evaluate the vascular anatomy. RADIATION DOSE REDUCTION: This exam was performed according to the departmental dose-optimization program which includes automated exposure control, adjustment of the mA and/or kV according to patient  size and/or use of iterative reconstruction technique. CONTRAST:  62mL OMNIPAQUE IOHEXOL 350 MG/ML SOLN COMPARISON:  Chest radiographs done on 07/07/2021 FINDINGS: Cardiovascular: There is homogeneous enhancement in thoracic aorta. There are scattered calcifications and atherosclerotic plaques in the thoracic aorta. Left vertebral is arising from the aortic arch. There are no intraluminal filling defects seen pulmonary artery branches. Mediastinum/Nodes: No significant lymphadenopathy is seen. Lungs/Pleura: Centrilobular emphysema is seen. There is no focal pulmonary consolidation. Small linear patchy infiltrates are seen in the posterior costophrenic angles. Minimal bilateral pleural effusions are seen. There is no pneumothorax. Calcified granuloma is seen in right upper lung field. Upper Abdomen: There is reflux of contrast into hepatic veins suggesting tricuspid incompetence. There are calcified granulomas in spleen. Musculoskeletal: No acute findings are seen. Review of the MIP images confirms the above findings. IMPRESSION: There is no evidence of pulmonary artery embolism. There is no evidence of thoracic aortic dissection. Aortic arteriosclerosis. Coronary artery calcifications are seen. COPD. There is no focal pulmonary consolidation. Small linear patchy densities are seen in the posterior aspects of both lower lung fields suggesting minimal subsegmental atelectasis/pneumonia. Minimal bilateral pleural effusions are noted. Electronically Signed   By: Ernie Avena M.D.   On: 03/26/2022 18:34    Lab Results: Personally reviewed CBC    Component Value Date/Time   WBC 6.0 07/08/2021 0520   RBC 4.09 07/08/2021 0520   HGB 12.3 07/08/2021 0520   HCT 38.3 07/08/2021 0520   PLT 220 07/08/2021 0520   MCV 93.6 07/08/2021 0520   MCH 30.1 07/08/2021 0520   MCHC 32.1 07/08/2021 0520   RDW 12.6 07/08/2021 0520    BMET    Component Value Date/Time   NA 139 07/09/2021 0127   K 4.2 07/09/2021 0127    CL 107 07/09/2021 0127   CO2 23 07/09/2021 0127   GLUCOSE 114 (H) 07/09/2021 0127   BUN 11 07/09/2021 0127   CREATININE 0.84 07/09/2021 0127   CALCIUM 9.0 07/09/2021 0127   GFRNONAA >60 07/09/2021 0127    BNP    Component Value Date/Time   BNP 39.6 07/07/2021 0934    ProBNP No results found for: "PROBNP"  Specialty Problems   None   No Known Allergies   There is no immunization history on file for this patient.  Past Medical History:  Diagnosis Date   COPD (chronic obstructive pulmonary disease) (HCC)     Tobacco History: Social History   Tobacco Use  Smoking Status Former   Packs/day: 2.00   Years: 38.00   Total pack years: 76.00   Types: Cigarettes   Quit date: 05/26/2016   Years since quitting: 5.8  Smokeless Tobacco Never   Counseling given: Not Answered   Continue to not smoke  Outpatient Encounter Medications as of 04/07/2022  Medication Sig   albuterol (VENTOLIN HFA) 108 (90  Base) MCG/ACT inhaler Inhale 2 puffs into the lungs every 6 (six) hours as needed for wheezing or shortness of breath.   Ascorbic Acid (VITAMIN C PO) Take 1 tablet by mouth daily.   aspirin 81 MG EC tablet Take 1 tablet (81 mg total) by mouth daily. Swallow whole.   carvedilol (COREG) 3.125 MG tablet Take 1 tablet (3.125 mg total) by mouth 2 (two) times daily with a meal.   Multiple Vitamins-Minerals (ZINC PO) Take 1 tablet by mouth daily.   sertraline (ZOLOFT) 25 MG tablet Take 25 mg by mouth daily.   VITAMIN D PO Take 1 tablet by mouth daily.   VITAMIN E PO Take 1 tablet by mouth daily.   budesonide-formoterol (SYMBICORT) 160-4.5 MCG/ACT inhaler Inhale 2 puffs into the lungs 2 (two) times daily. (Patient not taking: Reported on 04/07/2022)   No facility-administered encounter medications on file as of 04/07/2022.     Review of Systems  Review of Systems  N/a Physical Exam  BP (!) 142/76 (BP Location: Left Arm, Patient Position: Sitting, Cuff Size: Normal)   Pulse  60   Temp 98.5 F (36.9 C) (Oral)   Wt 105 lb (47.6 kg)   SpO2 98%   BMI 19.84 kg/m   Wt Readings from Last 5 Encounters:  04/07/22 105 lb (47.6 kg)  01/02/22 93 lb 9.6 oz (42.5 kg)  07/19/21 102 lb 2 oz (46.3 kg)  07/16/21 102 lb (46.3 kg)  07/09/21 104 lb 0.9 oz (47.2 kg)    BMI Readings from Last 5 Encounters:  04/07/22 19.84 kg/m  01/02/22 17.69 kg/m  07/19/21 20.63 kg/m  07/16/21 20.60 kg/m  07/09/21 19.66 kg/m     Physical Exam General: Sitting in chair, no acute distress Eyes: EOMI, no icterus Neck: Supple, no JVP Pulmonary: Clear, distant, normal work of breathing Cardiovascular: No edema, warm Abdomen: No significant bowel sounds present MSK: No synovitis, no joint effusion Neuro: Normal gait, no weakness Psych: Normal mood, full affect   Assessment & Plan:   COPD: Gold D based on frequent exacerbations.  Diagnosis based on PFTs performed in [facility] a few years ago.  Given burden of symptoms and frequent exacerbations, escalate Symbicort to [place].  This was too expensive.  Back to using Symbicort.  Provided number to call for many fraction assistance update.  Consider Trelegy if Judithann Sauger is is denied.  Dyspnea on exertion: Suspect related to COPD.  Significant emphysema on CT scan.  Improves with albuterol, bronchodilators.  Has stress-induced cardiomyopathy 06/2021 without adequate follow-up.   Encouraged her to follow-up with cardiology.  A referral to different group was provided at last visit.  Left shoulder/arm pain: Positional nature.  Reproducible with external rotation.  Concern for rotator cuff tendinitis.  She wants to try acupuncture.  This is fine.  Advised recommendation for referral to physical therapy and orthopedic surgery if does not improve.  She declines this at this time.  Symptoms stem from stretch injury reaching for door frame and falling to the ground February 2023.  Return in about 3 months (around 07/08/2022).   Lanier Clam, MD 04/07/2022

## 2022-04-07 NOTE — Patient Instructions (Addendum)
[place] patient Assistance Application Number for breztri [phone]  No changes to medication, if we can get the [facility] we will get this to you.  Otherwise continue Symbicort for now.  If Markus Daft is denied we will look into using Trelegy.  The arm and shoulder pain seems musculoskeletal in nature to me.  I am what about the rotator cuff being inflamed based on your exam.  Okay to try acupuncture.  Consider physical therapy, or referral to orthopedic surgeon.  I am happy to send in referrals in the future if you deem this necessary.  Return to clinic in 3 months or sooner as needed with Dr. Judeth Horn

## 2022-11-26 ENCOUNTER — Telehealth: Payer: Self-pay | Admitting: Pulmonary Disease

## 2022-11-26 NOTE — Telephone Encounter (Signed)
Patient states needs refill for Breztri. Pharmacy is [facility] [address] Needs to be under name Pamela Mcclain. Patient phone number is [phone].

## 2022-11-28 ENCOUNTER — Ambulatory Visit (INDEPENDENT_AMBULATORY_CARE_PROVIDER_SITE_OTHER): Payer: Medicaid Other | Admitting: Nurse Practitioner

## 2022-11-28 ENCOUNTER — Other Ambulatory Visit: Payer: Self-pay

## 2022-11-28 ENCOUNTER — Encounter: Payer: Self-pay | Admitting: Nurse Practitioner

## 2022-11-28 VITALS — BP 140/88 | HR 75 | Temp 98.1°F | Ht 61.0 in | Wt 109.0 lb

## 2022-11-28 DIAGNOSIS — Z87891 Personal history of nicotine dependence: Secondary | ICD-10-CM | POA: Diagnosis not present

## 2022-11-28 DIAGNOSIS — J432 Centrilobular emphysema: Secondary | ICD-10-CM

## 2022-11-28 MED ORDER — ALBUTEROL SULFATE (2.5 MG/3ML) 0.083% IN NEBU: 2.5000 mg | INHALATION_SOLUTION | Freq: Four times a day (QID) | RESPIRATORY_TRACT | 6 refills | Status: AC | PRN

## 2022-11-28 MED ORDER — BREZTRI AEROSPHERE 160-9-4.8 MCG/ACT IN AERO: 2.0000 | INHALATION_SPRAY | Freq: Two times a day (BID) | RESPIRATORY_TRACT | 11 refills | Status: DC

## 2022-11-28 MED ORDER — ALBUTEROL SULFATE HFA 108 (90 BASE) MCG/ACT IN AERS: 2.0000 | INHALATION_SPRAY | Freq: Four times a day (QID) | RESPIRATORY_TRACT | 3 refills | Status: DC | PRN

## 2022-11-28 NOTE — Assessment & Plan Note (Signed)
Former heavy smoker with 76 pack year history. Quit 2018. No suspicious nodules/masses on CTA chest from November 2023. Referral to lung cancer screening program.

## 2022-11-28 NOTE — Telephone Encounter (Signed)
ATC X1 LVM for patient Please advise Pamela Mcclain has been sent to pharamcy

## 2022-11-28 NOTE — Telephone Encounter (Signed)
ATC X1 LVM for patient Please advise Breztri has been sent to pharamcy 

## 2022-11-28 NOTE — Assessment & Plan Note (Addendum)
Compensated on current regimen. No recent hospitalizations or exacerbations requiring steroids/abx. She had better response to [facility] and would like to go back on this. Provided with samples and new rx placed. She will notify us if her new insurance does not cover this. Can consider Trelegy at that point. Encouraged to work on graded exercises. Needs PFTs upon return to assess severity of obstruction. Will provide her with neb and neb solution. Target mucociliary clearance in AM. Action plan in place.   Patient Instructions  Continue Albuterol inhaler 2 puffs or 3 mL neb every 6 hours as needed for shortness of breath or wheezing. Notify if symptoms persist despite rescue inhaler/neb use. Use neb in the morning to help open up your chest  Stop Symbicort and start Breztri 2 puffs Twice daily. Brush tongue and rinse mouth afterwards   Attend appointment with cardiology to get your blood pressure medications situated   Referral to lung cancer screening program  Follow up with Dr. Judeth Horn or Katie Avion Kutzer,NP in 6 weeks. If symptoms do not improve or worsen, please contact office for sooner follow up or seek emergency care.

## 2022-11-28 NOTE — Patient Instructions (Addendum)
Continue Albuterol inhaler 2 puffs or 3 mL neb every 6 hours as needed for shortness of breath or wheezing. Notify if symptoms persist despite rescue inhaler/neb use. Use neb in the morning to help open up your chest  Stop Symbicort and start Breztri 2 puffs Twice daily. Brush tongue and rinse mouth afterwards   Attend appointment with cardiology to get your blood pressure medications situated   Referral to lung cancer screening program  Follow up with Pamela Mcclain or Pamela Armend Hochstatter,NP in 6 weeks. If symptoms do not improve or worsen, please contact office for sooner follow up or seek emergency care.

## 2022-11-28 NOTE — Progress Notes (Signed)
@Patient  ID: Pamela Mcclain, female    DOB: Dec 17, 1960, 62 y.o.   MRN: [id number]  Chief Complaint  Patient presents with   Follow-up    Pt states she is still using the symbicort inhaler, still sob. At this time she wants to switch to breztri.    Referring provider: Ivonne Andrew, NP  HPI: 62 year old female, former smoker followed for COPD/emphysema. She is a patient of Dr. Laurena Spies and last seen in office 04/07/2022. Past medical history significant for NSTEMI, HTN, depression.   TEST/EVENTS:  07/07/2021 echo: EF 25-30%. LV dilated. RV size and function nl. Nl PASP. Trivial MR. 03/26/2022 CTA chest: atherosclerosis. No evidence of PE. Emphysema. Small linear infiltrates at costophrenic angles. Minimal b/l pleural effusions. Calcified granuloma.   04/07/2022: OV with Dr. Judeth Horn. Contacted 2-3 weeks ago d/t left arm pain radiating to neck and chest. CTA PE protocol was negative. She was asked to go to the ED for cardiac evaluation but declined. Continues to have left shoulder pain. Seems more MSK in nature; worse with different positions. Signs of rotator cuff inflammation/pain on exam. Referral to PT and orthopedics if symptoms do not improve; declined at this time. [place] is stable on symbicort. Better on Breztri but too expensive. Consider trelegy if patient assistance for Breztri denied.   11/28/2022: Today - follow up Patient presents today for follow up with her husband. She has been feeling about the same as her last visit. She did finally get insurance and wants to be able to go back to [place]. She tried this in the past and she felt like it helped her more than the Symbicort does. She gets short winded with longer distances and uphill climbing. Able to complete ADLs without difficulty. She has a lot of cough in the morning. Once she gets the phlegm up, she feels better. Thick, clear to white mucus. Notices an occasional wheeze. She denies any fevers, chills, night sweats,  hemoptysis, leg swelling, orthopnea, PND, palpitations, CP, anorexia, weight loss. She has not seen cardiology recently. She's also off her blood pressure medication. She's been monitoring them at home. She is planning to get back in with a new primary care provider and cardiologist now that she has new insurance.   No Known Allergies   There is no immunization history on file for this patient.  Past Medical History:  Diagnosis Date   COPD (chronic obstructive pulmonary disease) (HCC)     Tobacco History: Social History   Tobacco Use  Smoking Status Former   Packs/day: 2.00   Years: 38.00   Additional pack years: 0.00   Total pack years: 76.00   Types: Cigarettes   Quit date: 05/26/2016   Years since quitting: 6.5  Smokeless Tobacco Never   Counseling given: Not Answered   Outpatient Medications Prior to Visit  Medication Sig Dispense Refill   Ascorbic Acid (VITAMIN C PO) Take 1 tablet by mouth daily.     aspirin 81 MG EC tablet Take 1 tablet (81 mg total) by mouth daily. Swallow whole. 30 tablet 11   carvedilol (COREG) 3.125 MG tablet Take 1 tablet (3.125 mg total) by mouth 2 (two) times daily with a meal. 180 tablet 3   Multiple Vitamins-Minerals (ZINC PO) Take 1 tablet by mouth daily.     sertraline (ZOLOFT) 25 MG tablet Take 25 mg by mouth daily.     VITAMIN D PO Take 1 tablet by mouth daily.     VITAMIN E PO Take 1  tablet by mouth daily.     albuterol (VENTOLIN HFA) 108 (90 Base) MCG/ACT inhaler Inhale 2 puffs into the lungs every 6 (six) hours as needed for wheezing or shortness of breath.     budesonide-formoterol (SYMBICORT) 160-4.5 MCG/ACT inhaler Inhale 2 puffs into the lungs 2 (two) times daily. 1 each 11   No facility-administered medications prior to visit.     Review of Systems:   Constitutional: No weight loss or gain, night sweats, fevers, chills, fatigue, or lassitude. HEENT: No headaches, difficulty swallowing, tooth/dental problems, or sore throat. No  sneezing, itching, ear ache, nasal congestion, or post nasal drip CV:  No chest pain, orthopnea, PND, swelling in lower extremities, anasarca, dizziness, palpitations, syncope Resp: +shortness of breath with exertion; chronic productive cough; occasional wheeze. No excess mucus or change in color of mucus. No hemoptysis. No chest wall deformity GI:  No heartburn, indigestion, abdominal pain, nausea, vomiting, diarrhea, change in bowel habits, loss of appetite, bloody stools.  GU: No dysuria, change in color of urine, urgency or frequency.  Skin: No rash, lesions, ulcerations MSK:  No joint pain or swelling.   Neuro: No dizziness or lightheadedness.  Psych: No depression or anxiety. Mood stable.     Physical Exam:  BP (!) 140/88   Pulse 75   Temp 98.1 F (36.7 C) (Oral)   Ht 5\' 1"  (1.549 m)   Wt 109 lb (49.4 kg)   SpO2 99%   BMI 20.60 kg/m   GEN: Pleasant, interactive, well-appearing; in no acute distress. HEENT:  Normocephalic and atraumatic. PERRLA. Sclera white. Nasal turbinates pink, moist and patent bilaterally. No rhinorrhea present. Oropharynx pink and moist, without exudate or edema. No lesions, ulcerations, or postnasal drip.  NECK:  Supple w/ fair ROM. No JVD present. Normal carotid impulses w/o bruits. Thyroid symmetrical with no goiter or nodules palpated. No lymphadenopathy.   CV: RRR, no m/r/g, no peripheral edema. Pulses intact, +2 bilaterally. No cyanosis, pallor or clubbing. PULMONARY:  Unlabored, regular breathing. Clear bilaterally A&P w/o wheezes/rales/rhonchi. No accessory muscle use.  GI: BS present and normoactive. Soft, non-tender to palpation. No organomegaly or masses detected.  MSK: No erythema, warmth or tenderness. Cap refil <2 sec all extrem. No deformities or joint swelling noted.  Neuro: A/Ox3. No focal deficits noted.   Skin: Warm, no lesions or rashe Psych: Normal affect and behavior. Judgement and thought content appropriate.     Lab  Results:  CBC    Component Value Date/Time   WBC 6.0 07/08/2021 0520   RBC 4.09 07/08/2021 0520   HGB 12.3 07/08/2021 0520   HCT 38.3 07/08/2021 0520   PLT 220 07/08/2021 0520   MCV 93.6 07/08/2021 0520   MCH 30.1 07/08/2021 0520   MCHC 32.1 07/08/2021 0520   RDW 12.6 07/08/2021 0520    BMET    Component Value Date/Time   NA 139 07/09/2021 0127   K 4.2 07/09/2021 0127   CL 107 07/09/2021 0127   CO2 23 07/09/2021 0127   GLUCOSE 114 (H) 07/09/2021 0127   BUN 11 07/09/2021 0127   CREATININE 0.84 07/09/2021 0127   CALCIUM 9.0 07/09/2021 0127   GFRNONAA >60 07/09/2021 0127    BNP    Component Value Date/Time   BNP 39.6 07/07/2021 0934     Imaging:  No results found.        No data to display          No results found for: "NITRICOXIDE"  Assessment & Plan:   Centrilobular emphysema (HCC) Compensated on current regimen. No recent hospitalizations or exacerbations requiring steroids/abx. She had better response to [facility] and would like to go back on this. Provided with samples and new rx placed. She will notify us if her new insurance does not cover this. Can consider Trelegy at that point. Encouraged to work on graded exercises. Needs PFTs upon return to assess severity of obstruction. Will provide her with neb and neb solution. Target mucociliary clearance in AM. Action plan in place.   Patient Instructions  Continue Albuterol inhaler 2 puffs or 3 mL neb every 6 hours as needed for shortness of breath or wheezing. Notify if symptoms persist despite rescue inhaler/neb use. Use neb in the morning to help open up your chest  Stop Symbicort and start Breztri 2 puffs Twice daily. Brush tongue and rinse mouth afterwards   Attend appointment with cardiology to get your blood pressure medications situated   Referral to lung cancer screening program  Follow up with Dr. Judeth Horn or Katie Mischa Brittingham,NP in 6 weeks. If symptoms do not improve or worsen, please  contact office for sooner follow up or seek emergency care.    Former smoker Former heavy smoker with 76 pack year history. Quit 2018. No suspicious nodules/masses on CTA chest from November 2023. Referral to lung cancer screening program.   I spent 35 minutes of dedicated to the care of this patient on the date of this encounter to include pre-visit review of records, face-to-face time with the patient discussing conditions above, post visit ordering of testing, clinical documentation with the electronic health record, making appropriate referrals as documented, and communicating necessary findings to members of the patients care team.  Noemi Chapel, NP 11/28/2022  Pt aware and understands NP's role.

## 2022-12-16 ENCOUNTER — Telehealth: Payer: Self-pay | Admitting: Student

## 2022-12-16 NOTE — Telephone Encounter (Signed)
[facility] [address] [facility] [place]  PT said Pharm won't give her Markus Daft without a PA.  Please call to advise PT on action we take from here. Her number is [phone]

## 2022-12-19 ENCOUNTER — Other Ambulatory Visit (HOSPITAL_COMMUNITY): Payer: Self-pay

## 2022-12-19 ENCOUNTER — Telehealth: Payer: Self-pay

## 2022-12-19 DIAGNOSIS — J432 Centrilobular emphysema: Secondary | ICD-10-CM

## 2022-12-19 NOTE — Telephone Encounter (Signed)
PA submitted and is pending determination. Will be updated in additional encounter created.

## 2022-12-19 NOTE — Telephone Encounter (Signed)
*  Pulm  Pharmacy Patient Advocate Encounter   Received notification from Pt Calls Messages that prior authorization for Breztri Aerosphere 160-9-4.8MCG/ACT aerosol  is required/requested.   Insurance verification completed.   The patient is insured through [facility] .   Per test claim: PA required; PA submitted to [facility] via CoverMyMeds Key/confirmation #/EOC [id number] Status is pending

## 2022-12-22 NOTE — Telephone Encounter (Signed)
Called PT to advise action taken by PA dept.

## 2022-12-23 NOTE — Telephone Encounter (Signed)
PA has been DENIED due to:   Per your health plan's criteria, this drug is covered if you meet the following: One of the following: (1) You have failed one preferred drug as confirmed by claims history or submission of medical records. The preferred drugs: brand Advair Diskus and Dulera. (2) You cannot use one preferred drug (please specify contraindication or intolerance)

## 2022-12-25 ENCOUNTER — Other Ambulatory Visit: Payer: Self-pay | Admitting: Nurse Practitioner

## 2022-12-25 DIAGNOSIS — J432 Centrilobular emphysema: Secondary | ICD-10-CM

## 2022-12-26 MED ORDER — SPIRIVA RESPIMAT 2.5 MCG/ACT IN AERS: 2.0000 | INHALATION_SPRAY | Freq: Every day | RESPIRATORY_TRACT | 5 refills | Status: DC

## 2022-12-26 MED ORDER — MOMETASONE FURO-FORMOTEROL FUM 100-5 MCG/ACT IN AERO: 2.0000 | INHALATION_SPRAY | Freq: Two times a day (BID) | RESPIRATORY_TRACT | 5 refills | Status: DC

## 2022-12-26 NOTE — Addendum Note (Signed)
Addended by: Noemi Chapel on: 12/26/2022 09:58 AM   Modules accepted: Orders

## 2022-12-29 ENCOUNTER — Telehealth: Payer: Self-pay | Admitting: Nurse Practitioner

## 2022-12-29 MED ORDER — BREZTRI AEROSPHERE 160-9-4.8 MCG/ACT IN AERO: 2.0000 | INHALATION_SPRAY | Freq: Two times a day (BID) | RESPIRATORY_TRACT | 0 refills | Status: DC

## 2022-12-29 NOTE — Telephone Encounter (Signed)
Pt calling in bc she has just started the Tiotropium Bromide Monohydrate (SPIRIVA RESPIMAT) 2.5 MCG/ACT AERS [[id number]]  and she is having trouble stilll breathing. Pt states he feels the medication isn't getting into her lungs.

## 2022-12-29 NOTE — Telephone Encounter (Signed)
Called and spoke with patient. She stated that she was recently switched over to Spiriva 2.29mcg and Dulera after being on [place]. She was switched due to her insurance. She has been on the Spiriva 2.27mcg for 4 days and does not feel like she is receiving all of the medication. Her chest has been extremely tight. Increased wheezing as well as a productive cough with clear phlegm. Denied any fevers or body aches.   So far today, she has used her albuterol HFA 3 times for relief from the chest tightness.   I looked to see if I could find her denial letter to see if she needed proof if she has tried and failed one or two inhalers but the document has not uploaded yet. She   She stated that Markus Daft worked the best for her.   Dr. Maple Hudson, would you be ok if we supplied her with 2 Breztri samples until we can get the PA appeal straightened out?

## 2022-12-29 NOTE — Telephone Encounter (Signed)
Yes ok 2 Breztri samples. After that she needs to take it up with Dr Benjamine Sprague, NP for long term solution. She might benefit from [place].

## 2022-12-29 NOTE — Telephone Encounter (Signed)
Called and spoke with patient. She verbalized understanding. Samples of Breztri have been left at the front desk. Nothing further needed at time of call.

## 2023-01-08 ENCOUNTER — Ambulatory Visit (HOSPITAL_COMMUNITY)
Admission: RE | Admit: 2023-01-08 | Discharge: 2023-01-08 | Disposition: A | Discharge status: Home / Self Care | Payer: Medicaid Other | Source: Ambulatory Visit | Attending: Nurse Practitioner | Admitting: Nurse Practitioner

## 2023-01-08 ENCOUNTER — Other Ambulatory Visit: Payer: Self-pay | Admitting: Nurse Practitioner

## 2023-01-08 ENCOUNTER — Encounter: Payer: Self-pay | Admitting: Nurse Practitioner

## 2023-01-08 ENCOUNTER — Ambulatory Visit (INDEPENDENT_AMBULATORY_CARE_PROVIDER_SITE_OTHER): Payer: Medicaid Other | Admitting: Nurse Practitioner

## 2023-01-08 VITALS — BP 165/92 | HR 66 | Temp 97.2°F | Ht 61.0 in | Wt 110.2 lb

## 2023-01-08 DIAGNOSIS — R079 Chest pain, unspecified: Secondary | ICD-10-CM | POA: Diagnosis not present

## 2023-01-08 DIAGNOSIS — G8929 Other chronic pain: Secondary | ICD-10-CM

## 2023-01-08 DIAGNOSIS — M25512 Pain in left shoulder: Secondary | ICD-10-CM | POA: Diagnosis present

## 2023-01-08 DIAGNOSIS — I10 Essential (primary) hypertension: Secondary | ICD-10-CM | POA: Diagnosis not present

## 2023-01-08 DIAGNOSIS — Z13220 Encounter for screening for lipoid disorders: Secondary | ICD-10-CM

## 2023-01-08 MED ORDER — CARVEDILOL 3.125 MG PO TABS
3.1250 mg | ORAL_TABLET | Freq: Two times a day (BID) | ORAL | 3 refills | Status: DC
Start: 2023-01-08 — End: 2023-06-12

## 2023-01-08 NOTE — Patient Instructions (Signed)
1. Intermittent chest pain  - Ambulatory referral to Cardiology   2. Chronic left shoulder pain  - DG Shoulder Left   3. Primary hypertension  - carvedilol (COREG) 3.125 MG tablet; Take 1 tablet (3.125 mg total) by mouth 2 (two) times daily with a meal.  Dispense: 180 tablet; Refill: 3 - CBC - Comprehensive metabolic panel   4. Lipid screening  - Lipid Panel    Follow up:  Follow up in 3 months

## 2023-01-08 NOTE — Progress Notes (Signed)
@Patient  ID: Pamela Mcclain, female    DOB: November 13, 1960, 62 y.o.   MRN: [id number]  Chief Complaint  Patient presents with   Hypertension    Chest tightness no med over  a year    Referring provider: Ivonne Andrew, NP   HPI  Patient presents today to establish care.  She states that she does have past medical history of MI in 2023.  She did follow-up a few times with cardiology but states that she had to stop follow-up visits due to financial concerns.  She now has new insurance and would like to get reestablished with a primary care physician and have a referral placed to a new cardiologist.  She does currently see [facility] for history of emphysema. Denies f/c/s, n/v/d, hemoptysis, PND, leg swelling Denies chest pain or edema       No Known Allergies   There is no immunization history on file for this patient.  Past Medical History:  Diagnosis Date   COPD (chronic obstructive pulmonary disease) (HCC)     Tobacco History: Social History   Tobacco Use  Smoking Status Former   Current packs/day: 0.00   Average packs/day: 2.0 packs/day for 38.0 years (76.0 ttl pk-yrs)   Types: Cigarettes   Start date: 05/26/1978   Quit date: 05/26/2016   Years since quitting: 6.6  Smokeless Tobacco Never   Counseling given: Not Answered   Outpatient Encounter Medications as of 01/08/2023  Medication Sig   albuterol (PROVENTIL) (2.5 MG/3ML) 0.083% nebulizer solution Take 3 mLs (2.5 mg total) by nebulization every 6 (six) hours as needed for wheezing or shortness of breath.   albuterol (VENTOLIN HFA) 108 (90 Base) MCG/ACT inhaler Inhale 2 puffs into the lungs every 6 (six) hours as needed for wheezing or shortness of breath.   Ascorbic Acid (VITAMIN C PO) Take 1 tablet by mouth daily.   aspirin 81 MG EC tablet Take 1 tablet (81 mg total) by mouth daily. Swallow whole.   mometasone-formoterol (DULERA) 100-5 MCG/ACT AERO Inhale 2 puffs into the lungs in the morning and at  bedtime.   VITAMIN D PO Take 1 tablet by mouth daily.   VITAMIN E PO Take 1 tablet by mouth daily.   Budeson-Glycopyrrol-Formoterol (BREZTRI AEROSPHERE) 160-9-4.8 MCG/ACT AERO Inhale 2 puffs into the lungs in the morning and at bedtime. (Patient not taking: Reported on 01/08/2023)   carvedilol (COREG) 3.125 MG tablet Take 1 tablet (3.125 mg total) by mouth 2 (two) times daily with a meal.   Multiple Vitamins-Minerals (ZINC PO) Take 1 tablet by mouth daily. (Patient not taking: Reported on 01/08/2023)   sertraline (ZOLOFT) 25 MG tablet Take 25 mg by mouth daily. (Patient not taking: Reported on 01/08/2023)   Tiotropium Bromide Monohydrate (SPIRIVA RESPIMAT) 2.5 MCG/ACT AERS Inhale 2 puffs into the lungs daily. (Patient not taking: Reported on 01/08/2023)   [DISCONTINUED] carvedilol (COREG) 3.125 MG tablet Take 1 tablet (3.125 mg total) by mouth 2 (two) times daily with a meal. (Patient not taking: Reported on 01/08/2023)   No facility-administered encounter medications on file as of 01/08/2023.     Review of Systems  Review of Systems  Constitutional: Negative.   HENT: Negative.    Cardiovascular:  Positive for chest pain (intermittant).  Gastrointestinal: Negative.   Allergic/Immunologic: Negative.   Neurological: Negative.   Psychiatric/Behavioral: Negative.         Physical Exam  BP (!) 165/92   Pulse 66   Temp (!) 97.2 F (36.2 C)  Ht 5\' 1"  (1.549 m)   Wt 110 lb 3.2 oz (50 kg)   SpO2 99%   BMI 20.82 kg/m   Wt Readings from Last 5 Encounters:  01/08/23 110 lb 3.2 oz (50 kg)  11/28/22 109 lb (49.4 kg)  04/07/22 105 lb (47.6 kg)  01/02/22 93 lb 9.6 oz (42.5 kg)  07/19/21 102 lb 2 oz (46.3 kg)     Physical Exam Vitals and nursing note reviewed.  Constitutional:      General: She is not in acute distress.    Appearance: She is well-developed.  Cardiovascular:     Rate and Rhythm: Normal rate and regular rhythm.  Pulmonary:     Effort: Pulmonary effort is normal.      Breath sounds: Normal breath sounds.  Neurological:     Mental Status: She is alert and oriented to person, place, and time.      Lab Results:  CBC    Component Value Date/Time   WBC 6.0 07/08/2021 0520   RBC 4.09 07/08/2021 0520   HGB 12.3 07/08/2021 0520   HCT 38.3 07/08/2021 0520   PLT 220 07/08/2021 0520   MCV 93.6 07/08/2021 0520   MCH 30.1 07/08/2021 0520   MCHC 32.1 07/08/2021 0520   RDW 12.6 07/08/2021 0520    BMET    Component Value Date/Time   NA 139 07/09/2021 0127   K 4.2 07/09/2021 0127   CL 107 07/09/2021 0127   CO2 23 07/09/2021 0127   GLUCOSE 114 (H) 07/09/2021 0127   BUN 11 07/09/2021 0127   CREATININE 0.84 07/09/2021 0127   CALCIUM 9.0 07/09/2021 0127   GFRNONAA >60 07/09/2021 0127    BNP    Component Value Date/Time   BNP 39.6 07/07/2021 0934      Assessment & Plan:   Intermittent chest pain - Ambulatory referral to Cardiology   2. Chronic left shoulder pain  - DG Shoulder Left   3. Primary hypertension  - carvedilol (COREG) 3.125 MG tablet; Take 1 tablet (3.125 mg total) by mouth 2 (two) times daily with a meal.  Dispense: 180 tablet; Refill: 3 - CBC - Comprehensive metabolic panel   4. Lipid screening  - Lipid Panel    Follow up:  Follow up in 3 months     Ivonne Andrew, NP 01/08/2023

## 2023-01-08 NOTE — Assessment & Plan Note (Signed)
-   Ambulatory referral to Cardiology   2. Chronic left shoulder pain  - DG Shoulder Left   3. Primary hypertension  - carvedilol (COREG) 3.125 MG tablet; Take 1 tablet (3.125 mg total) by mouth 2 (two) times daily with a meal.  Dispense: 180 tablet; Refill: 3 - CBC - Comprehensive metabolic panel   4. Lipid screening  - Lipid Panel    Follow up:  Follow up in 3 months

## 2023-01-09 ENCOUNTER — Other Ambulatory Visit: Payer: Self-pay | Admitting: Nurse Practitioner

## 2023-01-09 DIAGNOSIS — E785 Hyperlipidemia, unspecified: Secondary | ICD-10-CM

## 2023-01-09 LAB — CBC
Hematocrit: 42.8 % (ref 34.0–46.6)
Hemoglobin: 14.2 g/dL (ref 11.1–15.9)
MCH: 30 pg (ref 26.6–33.0)
MCHC: 33.2 g/dL (ref 31.5–35.7)
MCV: 91 fL (ref 79–97)
Platelets: 291 10*3/uL (ref 150–450)
RBC: 4.73 x10E6/uL (ref 3.77–5.28)
RDW: 12.6 % (ref 11.7–15.4)
WBC: 5 10*3/uL (ref 3.4–10.8)

## 2023-01-09 LAB — LIPID PANEL
Chol/HDL Ratio: 3.6 ratio (ref 0.0–4.4)
Cholesterol, Total: 276 mg/dL — ABNORMAL HIGH (ref 100–199)
HDL: 77 mg/dL (ref >39)
LDL Chol Calc (NIH): 182 mg/dL — ABNORMAL HIGH (ref 0–99)
Triglycerides: 101 mg/dL (ref 0–149)
VLDL Cholesterol Cal: 17 mg/dL (ref 5–40)

## 2023-01-09 LAB — COMPREHENSIVE METABOLIC PANEL
ALT: 20 IU/L (ref 0–32)
AST: 23 IU/L (ref 0–40)
Albumin: 4.4 g/dL (ref 3.9–4.9)
Alkaline Phosphatase: 109 IU/L (ref 44–121)
BUN/Creatinine Ratio: 14 (ref 12–28)
BUN: 13 mg/dL (ref 8–27)
Bilirubin Total: 0.4 mg/dL (ref 0.0–1.2)
CO2: 24 mmol/L (ref 20–29)
Calcium: 9.5 mg/dL (ref 8.7–10.3)
Chloride: 102 mmol/L (ref 96–106)
Creatinine, Ser: 0.96 mg/dL (ref 0.57–1.00)
Globulin, Total: 2.8 g/dL (ref 1.5–4.5)
Glucose: 94 mg/dL (ref 70–99)
Potassium: 4.1 mmol/L (ref 3.5–5.2)
Sodium: 140 mmol/L (ref 134–144)
Total Protein: 7.2 g/dL (ref 6.0–8.5)
eGFR: 66 mL/min/{1.73_m2} (ref >59)

## 2023-01-09 MED ORDER — ROSUVASTATIN CALCIUM 5 MG PO TABS
5.0000 mg | ORAL_TABLET | Freq: Every day | ORAL | 11 refills | Status: DC
Start: 2023-01-09 — End: 2023-04-17

## 2023-01-12 ENCOUNTER — Ambulatory Visit (INDEPENDENT_AMBULATORY_CARE_PROVIDER_SITE_OTHER): Payer: Medicaid Other | Admitting: Nurse Practitioner

## 2023-01-12 ENCOUNTER — Encounter: Payer: Self-pay | Admitting: Nurse Practitioner

## 2023-01-12 VITALS — BP 140/90 | HR 64 | Temp 97.9°F | Ht 61.0 in | Wt 110.6 lb

## 2023-01-12 DIAGNOSIS — I10 Essential (primary) hypertension: Secondary | ICD-10-CM | POA: Diagnosis not present

## 2023-01-12 DIAGNOSIS — Z87891 Personal history of nicotine dependence: Secondary | ICD-10-CM | POA: Diagnosis not present

## 2023-01-12 DIAGNOSIS — J432 Centrilobular emphysema: Secondary | ICD-10-CM | POA: Diagnosis not present

## 2023-01-12 NOTE — Assessment & Plan Note (Signed)
Having difficulties with antihypertensives. Advised her to contact her cardiologist's office for sooner f/u to discuss. Monitor at home for goal <140/90. Red flag symptoms reviewed.

## 2023-01-12 NOTE — Progress Notes (Signed)
@Patient  ID: Pamela Mcclain, female    DOB: Dec 31, 1960, 62 y.o.   MRN: [id number]  Chief Complaint  Patient presents with   Follow-up    Pt is here for 6 wk F/U visit today (Centriobular Emphysema). Pt complains of SOB and coughing a little with white sputum (a few years).    Referring provider: Ivonne Andrew, NP  HPI: 62 year old female, former smoker followed for COPD/emphysema. She is a patient of Dr. Laurena Spies and last seen in office 11/28/2022 by Allison Quarry NP. Past medical history significant for NSTEMI, HTN, depression.   TEST/EVENTS:  07/07/2021 echo: EF 25-30%. LV dilated. RV size and function nl. Nl PASP. Trivial MR. 03/26/2022 CTA chest: atherosclerosis. No evidence of PE. Emphysema. Small linear infiltrates at costophrenic angles. Minimal b/l pleural effusions. Calcified granuloma.   04/07/2022: OV with Dr. Judeth Horn. Contacted 2-3 weeks ago d/t left arm pain radiating to neck and chest. CTA PE protocol was negative. She was asked to go to the ED for cardiac evaluation but declined. Continues to have left shoulder pain. Seems more MSK in nature; worse with different positions. Signs of rotator cuff inflammation/pain on exam. Referral to PT and orthopedics if symptoms do not improve; declined at this time. [place] is stable on symbicort. Better on Breztri but too expensive. Consider trelegy if patient assistance for Breztri denied.   11/28/2022: OV with Hilman Kissling NP for follow up with her husband. She has been feeling about the same as her last visit. She did finally get insurance and wants to be able to go back to [place]. She tried this in the past and she felt like it helped her more than the Symbicort does. She gets short winded with longer distances and uphill climbing. Able to complete ADLs without difficulty. She has a lot of cough in the morning. Once she gets the phlegm up, she feels better. Thick, clear to white mucus. Notices an occasional wheeze. She denies any fevers, chills, night  sweats, hemoptysis, leg swelling, orthopnea, PND, palpitations, CP, anorexia, weight loss. She has not seen cardiology recently. She's also off her blood pressure medication. She's been monitoring them at home. She is planning to get back in with a new primary care provider and cardiologist now that she has new insurance.   01/12/2023: Today - follow up Patient presents today for follow up. Breztri ended up not being covered by her insurance. She's on Dulera now, which she feels like helps but still not quite as good as her Breztri. She feels like her breathing is at her baseline. Able to complete ADLs without difficulties. Cough is primarily in the AM. Usually has some thick mucus that is white in color. Hasn't noticed much wheezing. She denies fevers, chills, hemoptysis, leg swelling, orthopnea, chest congestion, anorexia, weight loss. She is having some trouble with her blood pressure medicines. She feels like when she takes them, they make her BP too low and then she feels weak/fatigued but if she doesn't take them, her BP stays around 140 systolic. She has an appointment with her heart doctor in November. Denies chest pain, palpitations, lightheadedness/dizziness, syncope.   No Known Allergies   There is no immunization history on file for this patient.  Past Medical History:  Diagnosis Date   COPD (chronic obstructive pulmonary disease) (HCC)     Tobacco History: Social History   Tobacco Use  Smoking Status Former   Current packs/day: 0.00   Average packs/day: 2.0 packs/day for 38.0 years (76.0 ttl pk-yrs)  Types: Cigarettes   Start date: 05/26/1978   Quit date: 05/26/2016   Years since quitting: 6.6  Smokeless Tobacco Never   Counseling given: Not Answered   Outpatient Medications Prior to Visit  Medication Sig Dispense Refill   albuterol (PROVENTIL) (2.5 MG/3ML) 0.083% nebulizer solution Take 3 mLs (2.5 mg total) by nebulization every 6 (six) hours as needed for wheezing or  shortness of breath. 75 mL 6   albuterol (VENTOLIN HFA) 108 (90 Base) MCG/ACT inhaler Inhale 2 puffs into the lungs every 6 (six) hours as needed for wheezing or shortness of breath. 8 g 3   Ascorbic Acid (VITAMIN C PO) Take 1 tablet by mouth daily.     aspirin 81 MG EC tablet Take 1 tablet (81 mg total) by mouth daily. Swallow whole. 30 tablet 11   carvedilol (COREG) 3.125 MG tablet Take 1 tablet (3.125 mg total) by mouth 2 (two) times daily with a meal. 180 tablet 3   mometasone-formoterol (DULERA) 100-5 MCG/ACT AERO Inhale 2 puffs into the lungs in the morning and at bedtime. 1 each 5   Multiple Vitamins-Minerals (ZINC PO) Take 1 tablet by mouth daily. (Patient not taking: Reported on 01/08/2023)     rosuvastatin (CRESTOR) 5 MG tablet Take 1 tablet (5 mg total) by mouth daily. 30 tablet 11   VITAMIN D PO Take 1 tablet by mouth daily.     VITAMIN E PO Take 1 tablet by mouth daily.     Budeson-Glycopyrrol-Formoterol (BREZTRI AEROSPHERE) 160-9-4.8 MCG/ACT AERO Inhale 2 puffs into the lungs in the morning and at bedtime. (Patient not taking: Reported on 01/08/2023) 2 each 0   sertraline (ZOLOFT) 25 MG tablet Take 25 mg by mouth daily. (Patient not taking: Reported on 01/08/2023)     Tiotropium Bromide Monohydrate (SPIRIVA RESPIMAT) 2.5 MCG/ACT AERS Inhale 2 puffs into the lungs daily. (Patient not taking: Reported on 01/08/2023) 1 g 5   No facility-administered medications prior to visit.     Review of Systems:   Constitutional: No weight loss or gain, night sweats, fevers, chills. +fatigue, lassitude (with BP meds) HEENT: No headaches, difficulty swallowing, tooth/dental problems, or sore throat. No sneezing, itching, ear ache, nasal congestion, or post nasal drip CV:  No chest pain, orthopnea, PND, swelling in lower extremities, anasarca, dizziness, palpitations, syncope Resp: +shortness of breath with exertion; chronic productive cough; occasional wheeze. No excess mucus or change in color of  mucus. No hemoptysis. No chest wall deformity GI:  No heartburn, indigestion, abdominal pain, nausea, vomiting, diarrhea, change in bowel habits, loss of appetite, bloody stools.  GU: No dysuria, change in color of urine, urgency or frequency.  Skin: No rash, lesions, ulcerations MSK:  No joint pain or swelling.   Neuro: No dizziness or lightheadedness.  Psych: No depression or anxiety. Mood stable.     Physical Exam:  BP (!) 140/90 (BP Location: Right Leg, Cuff Size: Normal) Comment: Checked BP 5 min. later- 150/90 R Arm, Sitting  Pulse 64   Temp 97.9 F (36.6 C)   Ht 5\' 1"  (1.549 m)   Wt 110 lb 9.6 oz (50.2 kg)   SpO2 97%   BMI 20.90 kg/m   GEN: Pleasant, interactive, well-appearing; in no acute distress. HEENT:  Normocephalic and atraumatic. PERRLA. Sclera white. Nasal turbinates pink, moist and patent bilaterally. No rhinorrhea present. Oropharynx pink and moist, without exudate or edema. No lesions, ulcerations, or postnasal drip.  NECK:  Supple w/ fair ROM. No JVD present. Normal carotid impulses w/o  bruits. Thyroid symmetrical with no goiter or nodules palpated. No lymphadenopathy.   CV: RRR, no m/r/g, no peripheral edema. Pulses intact, +2 bilaterally. No cyanosis, pallor or clubbing. PULMONARY:  Unlabored, regular breathing. Clear bilaterally A&P w/o wheezes/rales/rhonchi. No accessory muscle use.  GI: BS present and normoactive. Soft, non-tender to palpation. No organomegaly or masses detected.  MSK: No erythema, warmth or tenderness. Cap refil <2 sec all extrem. No deformities or joint swelling noted.  Neuro: A/Ox3. No focal deficits noted.   Skin: Warm, no lesions or rashe Psych: Normal affect and behavior. Judgement and thought content appropriate.     Lab Results:  CBC    Component Value Date/Time   WBC 5.0 01/08/2023 1011   WBC 6.0 07/08/2021 0520   RBC 4.73 01/08/2023 1011   RBC 4.09 07/08/2021 0520   HGB 14.2 01/08/2023 1011   HCT 42.8 01/08/2023 1011    PLT 291 01/08/2023 1011   MCV 91 01/08/2023 1011   MCH 30.0 01/08/2023 1011   MCH 30.1 07/08/2021 0520   MCHC 33.2 01/08/2023 1011   MCHC 32.1 07/08/2021 0520   RDW 12.6 01/08/2023 1011    BMET    Component Value Date/Time   NA 140 01/08/2023 1011   K 4.1 01/08/2023 1011   CL 102 01/08/2023 1011   CO2 24 01/08/2023 1011   GLUCOSE 94 01/08/2023 1011   GLUCOSE 114 (H) 07/09/2021 0127   BUN 13 01/08/2023 1011   CREATININE 0.96 01/08/2023 1011   CALCIUM 9.5 01/08/2023 1011   GFRNONAA >60 07/09/2021 0127    BNP    Component Value Date/Time   BNP 39.6 07/07/2021 0934     Imaging:  DG Shoulder Left  Result Date: 01/08/2023 CLINICAL DATA:  62 year old female with a history of left shoulder pain - chronic EXAM: LEFT SHOULDER - 2+ VIEW COMPARISON:  None Available. FINDINGS: Glenohumeral joint congruent. Unremarkable proximal humerus. No focal soft tissue swelling. No radiopaque foreign body. No displaced fracture. Mild degenerative changes of the [facility] joint IMPRESSION: Negative for acute bony abnormality. Degenerative changes of the [facility] joint. Electronically Signed   By: Gilmer Mor D.O.   On: 01/08/2023 13:21    Administration History     None           No data to display          No results found for: "NITRICOXIDE"      Assessment & Plan:   Centrilobular emphysema (HCC) Compensated on current regimen. Unable to get coverage for [facility]. Will trial her on Trelegy to see if she receives benefit from use. Provided with samples today. If no improvement, can have her go back on [place], which she feels works better than Symbicort. Hasn't noticed improvement with Spiriva in the past. Encouraged to work on graded exercises. Action plan in place.  Patient Instructions  Continue Albuterol inhaler 2 puffs or 3 mL neb every 6 hours as needed for shortness of breath or wheezing. Notify if symptoms persist despite rescue inhaler/neb use. Use neb in the morning to help open up  your chest  Stop Dulera. Trial Trelegy 1 puff daily daily. Brush tongue and rinse mouth afterwards. Let me know how this works for you and I can send a new prescription    Call your heart doctor's office to see if you can get a sooner appointment   Attend lung cancer screening CT in November 2024.    Follow up with Dr. Judeth Horn or Katie Angelice Piech,NP in 6 weeks. If  symptoms do not improve or worsen, please contact office for sooner follow up or seek emergency care.    Hypertension Having difficulties with antihypertensives. Advised her to contact her cardiologist's office for sooner f/u to discuss. Monitor at home for goal <140/90. Red flag symptoms reviewed.   Former smoker Next CT chest due November 2024. Referral placed at last OV to lung cancer screening program.   I spent 35 minutes of dedicated to the care of this patient on the date of this encounter to include pre-visit review of records, face-to-face time with the patient discussing conditions above, post visit ordering of testing, clinical documentation with the electronic health record, making appropriate referrals as documented, and communicating necessary findings to members of the patients care team.  Noemi Chapel, NP 01/12/2023  Pt aware and understands NP's role.

## 2023-01-12 NOTE — Patient Instructions (Addendum)
Continue Albuterol inhaler 2 puffs or 3 mL neb every 6 hours as needed for shortness of breath or wheezing. Notify if symptoms persist despite rescue inhaler/neb use. Use neb in the morning to help open up your chest  Stop Dulera. Trial Trelegy 1 puff daily daily. Brush tongue and rinse mouth afterwards. Let me know how this works for you and I can send a new prescription    Call your heart doctor's office to see if you can get a sooner appointment   Attend lung cancer screening CT in November 2024.    Follow up with Dr. Judeth Horn or Katie Dajohn Ellender,NP in 6 weeks. If symptoms do not improve or worsen, please contact office for sooner follow up or seek emergency care.

## 2023-01-12 NOTE — Assessment & Plan Note (Signed)
Next CT chest due November 2024. Referral placed at last OV to lung cancer screening program.

## 2023-01-12 NOTE — Assessment & Plan Note (Signed)
Compensated on current regimen. Unable to get coverage for [facility]. Will trial her on Trelegy to see if she receives benefit from use. Provided with samples today. If no improvement, can have her go back on [place], which she feels works better than Symbicort. Hasn't noticed improvement with Spiriva in the past. Encouraged to work on graded exercises. Action plan in place.  Patient Instructions  Continue Albuterol inhaler 2 puffs or 3 mL neb every 6 hours as needed for shortness of breath or wheezing. Notify if symptoms persist despite rescue inhaler/neb use. Use neb in the morning to help open up your chest  Stop Dulera. Trial Trelegy 1 puff daily daily. Brush tongue and rinse mouth afterwards. Let me know how this works for you and I can send a new prescription    Call your heart doctor's office to see if you can get a sooner appointment   Attend lung cancer screening CT in November 2024.    Follow up with Dr. Judeth Horn or Katie Marizol Borror,NP in 6 weeks. If symptoms do not improve or worsen, please contact office for sooner follow up or seek emergency care.

## 2023-02-06 NOTE — Telephone Encounter (Signed)
Called pt no answer, left detailed message about inhalers that has been sent into the pharmacy

## 2023-02-23 ENCOUNTER — Ambulatory Visit (INDEPENDENT_AMBULATORY_CARE_PROVIDER_SITE_OTHER): Payer: Medicaid Other | Admitting: Nurse Practitioner

## 2023-02-23 ENCOUNTER — Encounter: Payer: Self-pay | Admitting: Nurse Practitioner

## 2023-02-23 VITALS — BP 140/90 | HR 67 | Ht 61.0 in | Wt 110.4 lb

## 2023-02-23 DIAGNOSIS — J432 Centrilobular emphysema: Secondary | ICD-10-CM

## 2023-02-23 DIAGNOSIS — J302 Other seasonal allergic rhinitis: Secondary | ICD-10-CM

## 2023-02-23 DIAGNOSIS — J309 Allergic rhinitis, unspecified: Secondary | ICD-10-CM | POA: Insufficient documentation

## 2023-02-23 DIAGNOSIS — Z87891 Personal history of nicotine dependence: Secondary | ICD-10-CM

## 2023-02-23 NOTE — Assessment & Plan Note (Signed)
LDCT chest lung cancer screening due November 2024.

## 2023-02-23 NOTE — Patient Instructions (Signed)
Continue Albuterol inhaler 2 puffs or 3 mL neb every 6 hours as needed for shortness of breath or wheezing. Notify if symptoms persist despite rescue inhaler/neb use. Use neb in the morning to help open up your chest  Continue Dulera 2 puffs Twice daily. Brush tongue and rinse mouth afterwards Start Spiriva 2 puffs daily Start daily allergy pill over the counter such as claritin, zyrtec, xyzal or allegra    Attend lung cancer screening CT in November 2024.    Follow up with Dr. Judeth Horn or Katie Raniyah Curenton,NP in 8-10 weeks. If symptoms do not improve or worsen, please contact office for sooner follow up or seek emergency care.

## 2023-02-23 NOTE — Progress Notes (Signed)
@Patient  ID: Pamela Mcclain, female    DOB: [birth year], 62 y.o.   MRN: [id number]  Chief Complaint  Patient presents with   Follow-up    Pt is here for Centrilobular Emphysema F/U visit. Pt complains of SOB for 1 week.    Referring provider: Ivonne Andrew, NP  HPI: 62 year old female, former smoker followed for COPD/emphysema. She is a patient of Pamela Mcclain and last seen in office 01/12/2023 by Pamela Quarry NP. Past medical history significant for NSTEMI, HTN, depression.   TEST/EVENTS:  07/07/2021 echo: EF 25-30%. LV dilated. RV size and function nl. Nl PASP. Trivial MR. 03/26/2022 CTA chest: atherosclerosis. No evidence of PE. Emphysema. Small linear infiltrates at costophrenic angles. Minimal b/l pleural effusions. Calcified granuloma.   04/07/2022: OV with Pamela Mcclain. Contacted 2-3 weeks ago d/t left arm pain radiating to neck and chest. CTA PE protocol was negative. She was asked to go to the ED for cardiac evaluation but declined. Continues to have left shoulder pain. Seems more MSK in nature; worse with different positions. Signs of rotator cuff inflammation/pain on exam. Referral to PT and orthopedics if symptoms do not improve; declined at this time. Breathig is stable on symbicort. Better on Breztri but too expensive. Consider trelegy if patient assistance for Breztri denied.   11/28/2022: OV with Pamela Sayer NP for follow up with her husband. She has been feeling about the same as her last visit. She did finally get insurance and wants to be able to go back to [place]. She tried this in the past and she felt like it helped her more than the Symbicort does. She gets short winded with longer distances and uphill climbing. Able to complete ADLs without difficulty. She has a lot of cough in the morning. Once she gets the phlegm up, she feels better. Thick, clear to white mucus. Notices an occasional wheeze. She denies any fevers, chills, night sweats, hemoptysis, leg swelling, orthopnea, PND,  palpitations, CP, anorexia, weight loss. She has not seen cardiology recently. She's also off her blood pressure medication. She's been monitoring them at home. She is planning to get back in with a new primary care provider and cardiologist now that she has new insurance.   01/12/2023: Ov with Pamela Sublette NP for follow up. Breztri ended up not being covered by her insurance. She's on Dulera now, which she feels like helps but still not quite as good as her Breztri. She feels like her breathing is at her baseline. Able to complete ADLs without difficulties. Cough is primarily in the AM. Usually has some thick mucus that is white in color. Hasn't noticed much wheezing. She denies fevers, chills, hemoptysis, leg swelling, orthopnea, chest congestion, anorexia, weight loss. She is having some trouble with her blood pressure medicines. She feels like when she takes them, they make her BP too low and then she feels weak/fatigued but if she doesn't take them, her BP stays around 140 systolic. She has an appointment with her heart doctor in November. Denies chest pain, palpitations, lightheadedness/dizziness, syncope.   02/23/2023: Today - follow up Patient presents today for follow up. She was started on Trelegy at our last visit. Unfortunately, she felt like this gave her joint pains so she stopped it and went back to her [facility]. She has been doing okay. Feeling about the same for the most part. She does feel like her eyes have been a little more itchy and watery recently. Thinks she may be coughing just a little bit more.  Breathing is stable. No wheezing or chest congestion. Hasn't had to use albuterol too much.   No Known Allergies   There is no immunization history on file for this patient.  Past Medical History:  Diagnosis Date   COPD (chronic obstructive pulmonary disease) (HCC)     Tobacco History: Social History   Tobacco Use  Smoking Status Former   Current packs/day: 0.00   Average packs/day: 2.0  packs/day for 38.0 years (76.0 ttl pk-yrs)   Types: Cigarettes   Start date: 05/26/1978   Quit date: 05/26/2016   Years since quitting: 6.7  Smokeless Tobacco Never   Counseling given: Not Answered   Outpatient Medications Prior to Visit  Medication Sig Dispense Refill   albuterol (PROVENTIL) (2.5 MG/3ML) 0.083% nebulizer solution Take 3 mLs (2.5 mg total) by nebulization every 6 (six) hours as needed for wheezing or shortness of breath. 75 mL 6   albuterol (VENTOLIN HFA) 108 (90 Base) MCG/ACT inhaler Inhale 2 puffs into the lungs every 6 (six) hours as needed for wheezing or shortness of breath. 8 g 3   Ascorbic Acid (VITAMIN C PO) Take 1 tablet by mouth daily.     aspirin 81 MG EC tablet Take 1 tablet (81 mg total) by mouth daily. Swallow whole. 30 tablet 11   carvedilol (COREG) 3.125 MG tablet Take 1 tablet (3.125 mg total) by mouth 2 (two) times daily with a meal. 180 tablet 3   mometasone-formoterol (DULERA) 100-5 MCG/ACT AERO Inhale 2 puffs into the lungs in the morning and at bedtime. 1 each 5   Multiple Vitamins-Minerals (ZINC PO) Take 1 tablet by mouth daily.     rosuvastatin (CRESTOR) 5 MG tablet Take 1 tablet (5 mg total) by mouth daily. 30 tablet 11   VITAMIN D PO Take 1 tablet by mouth daily.     VITAMIN E PO Take 1 tablet by mouth daily.     No facility-administered medications prior to visit.     Review of Systems:   Constitutional: No weight loss or gain, night sweats, fevers, chills. +fatigue (baseline) HEENT: No headaches, difficulty swallowing, tooth/dental problems, or sore throat. No sneezing,  ear ache, nasal congestion. +itchy/watery eyes, post nasal drip CV:  No chest pain, orthopnea, PND, swelling in lower extremities, anasarca, dizziness, palpitations, syncope Resp: +shortness of breath with exertion; chronic productive cough. No wheeze. No excess mucus or change in color of mucus. No hemoptysis. No chest wall deformity GI:  No heartburn, indigestion, abdominal  pain, nausea, vomiting, diarrhea, change in bowel habits, loss of appetite, bloody stools.  GU: No dysuria, change in color of urine, urgency or frequency.  Skin: No rash, lesions, ulcerations MSK:  No joint pain or swelling.   Neuro: No dizziness or lightheadedness.  Psych: No depression or anxiety. Mood stable.     Physical Exam:  BP (!) 140/90 (BP Location: Left Arm, Cuff Size: Normal)   Pulse 67   Ht 5\' 1"  (1.549 m)   Wt 110 lb 6.4 oz (50.1 kg)   SpO2 96%   BMI 20.86 kg/m   GEN: Pleasant, interactive, well-appearing; in no acute distress. HEENT:  Normocephalic and atraumatic. PERRLA. Sclera white. Nasal turbinates boggy, moist and patent bilaterally. No rhinorrhea present. Oropharynx pink and moist, without exudate or edema. No lesions, ulcerations, or postnasal drip.  NECK:  Supple w/ fair ROM. No JVD present. Normal carotid impulses w/o bruits. Thyroid symmetrical with no goiter or nodules palpated. No lymphadenopathy.   CV: RRR, no m/r/g, no  peripheral edema. Pulses intact, +2 bilaterally. No cyanosis, pallor or clubbing. PULMONARY:  Unlabored, regular breathing. Clear bilaterally A&P w/o wheezes/rales/rhonchi. No accessory muscle use.  GI: BS present and normoactive. Soft, non-tender to palpation. No organomegaly or masses detected.  MSK: No erythema, warmth or tenderness. Cap refil <2 sec all extrem. No deformities or joint swelling noted.  Neuro: A/Ox3. No focal deficits noted.   Skin: Warm, no lesions or rashe Psych: Normal affect and behavior. Judgement and thought content appropriate.     Lab Results:  CBC    Component Value Date/Time   WBC 5.0 01/08/2023 1011   WBC 6.0 07/08/2021 0520   RBC 4.73 01/08/2023 1011   RBC 4.09 07/08/2021 0520   HGB 14.2 01/08/2023 1011   HCT 42.8 01/08/2023 1011   PLT 291 01/08/2023 1011   MCV 91 01/08/2023 1011   MCH 30.0 01/08/2023 1011   MCH 30.1 07/08/2021 0520   MCHC 33.2 01/08/2023 1011   MCHC 32.1 07/08/2021 0520   RDW  12.6 01/08/2023 1011    BMET    Component Value Date/Time   NA 140 01/08/2023 1011   K 4.1 01/08/2023 1011   CL 102 01/08/2023 1011   CO2 24 01/08/2023 1011   GLUCOSE 94 01/08/2023 1011   GLUCOSE 114 (H) 07/09/2021 0127   BUN 13 01/08/2023 1011   CREATININE 0.96 01/08/2023 1011   CALCIUM 9.5 01/08/2023 1011   GFRNONAA >60 07/09/2021 0127    BNP    Component Value Date/Time   BNP 39.6 07/07/2021 0934     Imaging:  No results found.  Administration History     None           No data to display          No results found for: "NITRICOXIDE"      Assessment & Plan:   Centrilobular emphysema (HCC) Unable to tolerate Trelegy. Has done okay with Breztri in the past. Will have her add on Spiriva to [facility] to accomplish triple therapy regimen. Teachback performed. She has Spiriva at home - will notify if she needs a new rx. Encouraged to remain active. Action plan in place.  Patient Instructions  Continue Albuterol inhaler 2 puffs or 3 mL neb every 6 hours as needed for shortness of breath or wheezing. Notify if symptoms persist despite rescue inhaler/neb use. Use neb in the morning to help open up your chest  Continue Dulera 2 puffs Twice daily. Brush tongue and rinse mouth afterwards Start Spiriva 2 puffs daily Start daily allergy pill over the counter such as claritin, zyrtec, xyzal or allegra    Attend lung cancer screening CT in November 2024.    Follow up with Pamela Mcclain or Pamela Kennis Wissmann,NP in 8-10 weeks. If symptoms do not improve or worsen, please contact office for sooner follow up or seek emergency care.   Allergic rhinitis Flare in allergy symptoms. Suspect increased cough from postnasal drainage. Add on daily antihistamine.   Former smoker LDCT chest lung cancer screening due November 2024.     I spent 35 minutes of dedicated to the care of this patient on the date of this encounter to include pre-visit review of records, face-to-face time  with the patient discussing conditions above, post visit ordering of testing, clinical documentation with the electronic health record, making appropriate referrals as documented, and communicating necessary findings to members of the patients care team.  Noemi Chapel, NP 02/23/2023  Pt aware and understands NP's role.

## 2023-02-23 NOTE — Assessment & Plan Note (Signed)
Unable to tolerate Trelegy. Has done okay with Breztri in the past. Will have her add on Spiriva to [facility] to accomplish triple therapy regimen. Teachback performed. She has Spiriva at home - will notify if she needs a new rx. Encouraged to remain active. Action plan in place.  Patient Instructions  Continue Albuterol inhaler 2 puffs or 3 mL neb every 6 hours as needed for shortness of breath or wheezing. Notify if symptoms persist despite rescue inhaler/neb use. Use neb in the morning to help open up your chest  Continue Dulera 2 puffs Twice daily. Brush tongue and rinse mouth afterwards Start Spiriva 2 puffs daily Start daily allergy pill over the counter such as claritin, zyrtec, xyzal or allegra    Attend lung cancer screening CT in November 2024.    Follow up with Dr. Judeth Horn or Katie Ron Junco,NP in 8-10 weeks. If symptoms do not improve or worsen, please contact office for sooner follow up or seek emergency care.

## 2023-02-23 NOTE — Assessment & Plan Note (Addendum)
Flare in allergy symptoms. Suspect increased cough from postnasal drainage. Add on daily antihistamine.

## 2023-02-26 ENCOUNTER — Ambulatory Visit (HOSPITAL_BASED_OUTPATIENT_CLINIC_OR_DEPARTMENT_OTHER): Payer: Medicaid Other | Admitting: Family Medicine

## 2023-03-26 ENCOUNTER — Other Ambulatory Visit: Payer: Self-pay | Admitting: Nurse Practitioner

## 2023-03-26 DIAGNOSIS — J432 Centrilobular emphysema: Secondary | ICD-10-CM

## 2023-03-26 NOTE — Progress Notes (Signed)
Cardiology Office Note:  .   Date:  03/27/2023  ID:  Pamela Mcclain, DOB [birth year], MRN [id number] PCP: Ivonne Andrew, NP  [facility] Providers Cardiologist:  Reatha Harps, MD { History of Present Illness: Pamela Mcclain is a 62 y.o. female with history of CHF, CAD, HLD, COPD who presents for the evaluation of chest pain at the request of Ivonne Andrew, NP.  Discussed the use of AI scribe software for clinical note transcription with the patient, who gave verbal consent to proceed.  History of Present Illness   The patient, with a history of nonobstructive CAD, takotsubo stress-induced cardiomyopathy, and COPD, presents with ongoing chest pain and shortness of breath. The pain, described as pressure in the chest and sharp down the arm, starts in the chest, radiates through the shoulder, down the arm, and into the neck. The pain is associated with shortness of breath, to the point where the patient feels like she has no air. The symptoms have been ongoing for a long time, even before the heart catheterization performed last year. The symptoms occur daily, particularly with activity, and are relieved by rest and use of an inhaler. The patient also reports an inability to hold her bladder during these episodes. Symptoms have occurred for years.   The patient has been on carvedilol, aspirin, and a statin for cholesterol management. The cholesterol level remains a bit high. The patient has a history of systolic heart failure, which was likely stress-induced. The patient has been a smoker for about 30 years but has quit. The patient does not work currently and has adult children.          Problem List Takotsubo CM -EF 25-30%, apical ballooning  Non-obstructive CAD  -30% RCA 3. COPD 4. HLD -T chol 276, HDL 77, LDL 182, TG 101    ROS: All other ROS reviewed and negative. Pertinent positives noted in the HPI.     Studies Reviewed: Marland Kitchen   EKG  Interpretation Date/Time:  Friday March 27 2023 12:48:20 EDT Ventricular Rate:  62 PR Interval:  134 QRS Duration:  70 QT Interval:  410 QTC Calculation: 416 R Axis:   67  Text Interpretation: Normal sinus rhythm Normal ECG Confirmed by Lennie Odor [phone]) on 03/27/2023 12:58:26 PM    TTE 07/08/2021  1. Left ventricular ejection fraction, by estimation, is 25 to 30%. The  left ventricle has severely decreased function. The left ventricle  demonstrates regional wall motion abnormalities (see scoring  diagram/findings for description). The left  ventricular internal cavity size was moderately dilated. Left ventricular  diastolic function could not be evaluated.   2. Right ventricular systolic function is normal. The right ventricular  size is normal. There is normal pulmonary artery systolic pressure.   3. The mitral valve is normal in structure. Trivial mitral valve  regurgitation.   4. The aortic valve is normal in structure. Aortic valve regurgitation is  not visualized.   5. The inferior vena cava is normal in size with <50% respiratory  variability, suggesting right atrial pressure of 8 mmHg.   LHC 07/08/2021   Prox RCA to Mid RCA lesion is 30% stenosed.   There is severe left ventricular systolic dysfunction.   The left ventricular ejection fraction is 25-35% by visual estimate.   Physical Exam:   VS:  BP (!) 152/88 (BP Location: Left Arm, Patient Position: Sitting, Cuff Size: Normal)   Pulse 62   Ht 5\' 1"  (1.549 m)  Wt 111 lb 6.4 oz (50.5 kg)   SpO2 96%   BMI 21.05 kg/m    Wt Readings from Last 3 Encounters:  03/27/23 111 lb 6.4 oz (50.5 kg)  02/23/23 110 lb 6.4 oz (50.1 kg)  01/12/23 110 lb 9.6 oz (50.2 kg)    GEN: Well nourished, well developed in no acute distress NECK: No JVD; No carotid bruits CARDIAC: RRR, no murmurs, rubs, gallops RESPIRATORY:  Clear to auscultation without rales, wheezing or rhonchi  ABDOMEN: Soft, non-tender,  non-distended EXTREMITIES:  No edema; No deformity  ASSESSMENT AND PLAN: .   Assessment and Plan    Chest Pain Daily chest pain with exertion, described as pressure radiating to the back, arm, and neck. History of nonobstructive CAD and takotsubo stress-induced cardiomyopathy. EKG normal. -Order coronary CT angiography to evaluate coronary arteries. -Repeat echocardiogram to assess heart function. -Continue aspirin and statin therapy. -BMP and BNP today.   Hypertension Blood pressure slightly elevated. Currently on carvedilol. -Add losartan 25mg  daily. -Advise patient to keep a log of blood pressure readings.  Hyperlipidemia LDL 182 on Crestor 5mg , recently started. -Continue Crestor 5mg . -Plan to recheck cholesterol levels in 3 months.  COPD Patient experiences shortness of breath with exertion, uses inhaler for relief. -Continue current management.  Follow-up Plan for 41-month follow-up appointment.              Follow-up: Return in about 4 months (around 07/25/2023).   Signed, Lenna Gilford. Flora Lipps, MD, [facility]  [facility]  [address] [place], [place] [address] [phone]  2:05 PM

## 2023-03-27 ENCOUNTER — Encounter: Payer: Self-pay | Admitting: Cardiovascular Disease

## 2023-03-27 ENCOUNTER — Ambulatory Visit: Payer: Medicaid Other | Attending: Cardiovascular Disease | Admitting: Cardiovascular Disease

## 2023-03-27 VITALS — BP 152/88 | HR 62 | Ht 61.0 in | Wt 111.4 lb

## 2023-03-27 DIAGNOSIS — I5181 Takotsubo syndrome: Secondary | ICD-10-CM

## 2023-03-27 DIAGNOSIS — I2510 Atherosclerotic heart disease of native coronary artery without angina pectoris: Secondary | ICD-10-CM

## 2023-03-27 DIAGNOSIS — R0602 Shortness of breath: Secondary | ICD-10-CM | POA: Diagnosis not present

## 2023-03-27 DIAGNOSIS — R072 Precordial pain: Secondary | ICD-10-CM

## 2023-03-27 DIAGNOSIS — E782 Mixed hyperlipidemia: Secondary | ICD-10-CM

## 2023-03-27 DIAGNOSIS — R079 Chest pain, unspecified: Secondary | ICD-10-CM

## 2023-03-27 DIAGNOSIS — I10 Essential (primary) hypertension: Secondary | ICD-10-CM

## 2023-03-27 MED ORDER — METOPROLOL TARTRATE 50 MG PO TABS: ORAL_TABLET | ORAL | 0 refills | Status: DC

## 2023-03-27 NOTE — Patient Instructions (Addendum)
Medication Instructions:  -Start Losartan 25mg , once daily  - Take Metoprolol tartrate 50 mg PO  2 hours before cardiac testing    *If you need a refill on your cardiac medications before your next appointment, please call your pharmacy*   Lab Work: BMET  B Natriuretic peptide    If you have labs (blood work) drawn today and your tests are completely normal, you will receive your results only by: MyChart Message (if you have MyChart) OR A paper copy in the mail If you have any lab test that is abnormal or we need to change your treatment, we will call you to review the results.   Testing/Procedures:   Your cardiac CT will be scheduled at one of the below locations:   [facility] [address] [place], [place] [address] [phone]   If scheduled at [facility], please arrive at the [facility] and Children's Entrance (Entrance C2) of [facility] 30 minutes prior to test start time. You can use the FREE valet parking offered at entrance C (encouraged to control the heart rate for the test)  Proceed to the [facility] Radiology Department (first floor) to check-in and test prep.  All radiology patients and guests should use entrance C2 at [facility], accessed from [facility], even though the hospital's physical address listed is [address]      There is spacious parking and easy access to the radiology department from the [facility] entrance. Please enter here and check-in with the desk attendant.   Please follow these instructions carefully (unless otherwise directed):  An IV will be required for this test and Nitroglycerin will be given.    On the Night Before the Test: Be sure to Drink plenty of water. Do not consume any caffeinated/decaffeinated beverages or chocolate 12 hours prior to your test. Do not take any antihistamines 12 hours prior to your test.   On the Day of the Test: Drink  plenty of water until 1 hour prior to the test. Do not eat any food 1 hour prior to test. You may take your regular medications prior to the test.  Take metoprolol (Lopressor) two hours prior to test. If you take Furosemide/Hydrochlorothiazide/Spironolactone, please HOLD on the morning of the test. FEMALES- please wear underwire-free bra if available, avoid dresses & tight clothing       After the Test: Drink plenty of water. After receiving IV contrast, you may experience a mild flushed feeling. This is normal. On occasion, you may experience a mild rash up to 24 hours after the test. This is not dangerous. If this occurs, you can take Benadryl 25 mg and increase your fluid intake. If you experience trouble breathing, this can be serious. If it is severe call 911 IMMEDIATELY. If it is mild, please call our office. If you take any of these medications: Glipizide/Metformin, Avandament, Glucavance, please do not take 48 hours after completing test unless otherwise instructed.  We will call to schedule your test 2-4 weeks out understanding that some insurance companies will need an authorization prior to the service being performed.   For more information and frequently asked questions, please visit our website : [email or web address]  For non-scheduling related questions, please contact the cardiac imaging nurse navigator should you have any questions/concerns: Cardiac Imaging Nurse Navigators Direct Office Dial: [phone]   For scheduling needs, including cancellations and rescheduling, please call [place], [phone].  Echo will be scheduled at [address].  Your physician has requested that you have an echocardiogram. Echocardiography is a painless test that uses sound waves to create images of your heart. It provides your doctor with information about the size and shape of your heart and how well your heart's chambers and valves are working.  This procedure takes approximately one hour. There are no restrictions for this procedure. Please do NOT wear cologne, perfume, aftershave, or lotions (deodorant is allowed). Please arrive 15 minutes prior to your appointment time.    Follow-Up: At [facility], you and your health needs are our priority.  As part of our continuing mission to provide you with exceptional heart care, we have created designated Provider Care Teams.  These Care Teams include your primary Cardiologist (physician) and Advanced Practice Providers (APPs -  Physician Assistants and Nurse Practitioners) who all work together to provide you with the care you need, when you need it.  We recommend signing up for the patient portal called "MyChart".  Sign up information is provided on this After Visit Summary.  MyChart is used to connect with patients for Virtual Visits (Telemedicine).  Patients are able to view lab/test results, encounter notes, upcoming appointments, etc.  Non-urgent messages can be sent to your provider as well.   To learn more about what you can do with MyChart, go to [email or web address].    Your next appointment:   3 month(s)  The format for your next appointment:   In Person  Provider:   Reatha Harps, MD    Other Instructions

## 2023-03-30 ENCOUNTER — Encounter: Payer: Self-pay | Admitting: Cardiovascular Disease

## 2023-03-30 DIAGNOSIS — E785 Hyperlipidemia, unspecified: Secondary | ICD-10-CM

## 2023-03-30 LAB — BASIC METABOLIC PANEL
BUN/Creatinine Ratio: 18 (ref 12–28)
BUN: 18 mg/dL (ref 8–27)
CO2: 25 mmol/L (ref 20–29)
Calcium: 10.1 mg/dL (ref 8.7–10.3)
Chloride: 104 mmol/L (ref 96–106)
Creatinine, Ser: 0.99 mg/dL (ref 0.57–1.00)
Glucose: 84 mg/dL (ref 70–99)
Potassium: 4.5 mmol/L (ref 3.5–5.2)
Sodium: 143 mmol/L (ref 134–144)
eGFR: 64 mL/min/{1.73_m2} (ref >59)

## 2023-03-30 LAB — BRAIN NATRIURETIC PEPTIDE: BNP: 43.2 pg/mL (ref 0.0–100.0)

## 2023-03-30 MED ORDER — LOSARTAN POTASSIUM 25 MG PO TABS
25.0000 mg | ORAL_TABLET | Freq: Every day | ORAL | 3 refills | Status: DC
Start: 2023-03-30 — End: 2023-06-12

## 2023-04-08 ENCOUNTER — Other Ambulatory Visit: Payer: Self-pay | Admitting: Nurse Practitioner

## 2023-04-08 DIAGNOSIS — J432 Centrilobular emphysema: Secondary | ICD-10-CM

## 2023-04-09 ENCOUNTER — Encounter: Payer: Self-pay | Admitting: Nurse Practitioner

## 2023-04-09 ENCOUNTER — Ambulatory Visit (INDEPENDENT_AMBULATORY_CARE_PROVIDER_SITE_OTHER): Payer: Medicaid Other | Admitting: Nurse Practitioner

## 2023-04-09 VITALS — BP 127/71 | HR 76 | Ht 61.0 in | Wt 113.0 lb

## 2023-04-09 DIAGNOSIS — H9202 Otalgia, left ear: Secondary | ICD-10-CM

## 2023-04-09 DIAGNOSIS — M2550 Pain in unspecified joint: Secondary | ICD-10-CM

## 2023-04-09 DIAGNOSIS — K429 Umbilical hernia without obstruction or gangrene: Secondary | ICD-10-CM | POA: Diagnosis not present

## 2023-04-09 DIAGNOSIS — R6884 Jaw pain: Secondary | ICD-10-CM | POA: Diagnosis not present

## 2023-04-09 NOTE — Progress Notes (Signed)
Subjective   Patient ID: Pamela Mcclain, female    DOB: 10-15-1960, 62 y.o.   MRN: [id number]  Chief Complaint  Patient presents with   Abdominal Pain    Referring provider: Ivonne Andrew, NP  Pamela Mcclain is a 62 y.o. female with Past Medical History: No date: CHF (congestive heart failure) (HCC) No date: COPD (chronic obstructive pulmonary disease) (HCC) No date: Coronary artery disease No date: Hyperlipidemia No date: Hypertension   Abdominal Pain Associated symptoms include arthralgias and myalgias.    Patient presents today for acute visit.  She states that she has been having issues with an umbilical hernia for a while.  She states that it is progressively worsening.  We discussed that we will refer her for general surgery.  Patient also complain pains of hard area the base of her left ear into her left jaw.  She states it is painful.  She states that this has been present for for a couple years.  We will refer her to ENT for further evaluation.  Patient is also requesting testing for rheumatoid arthritis and lupus.  She states that her daughter was recently diagnosed with rheumatoid arthritis.  Patient states that she does have multiple joint pain. Denies f/c/s, n/v/d, hemoptysis, PND, leg swelling Denies chest pain or edema       No Known Allergies   There is no immunization history on file for this patient.  Tobacco History: Social History   Tobacco Use  Smoking Status Former   Current packs/day: 0.00   Average packs/day: 2.0 packs/day for 38.0 years (76.0 ttl pk-yrs)   Types: Cigarettes   Start date: 05/26/1978   Quit date: 05/26/2016   Years since quitting: 6.8  Smokeless Tobacco Never   Counseling given: Not Answered   Outpatient Encounter Medications as of 04/09/2023  Medication Sig   albuterol (PROVENTIL) (2.5 MG/3ML) 0.083% nebulizer solution Take 3 mLs (2.5 mg total) by nebulization every 6 (six) hours as needed for wheezing or shortness of  breath.   Ascorbic Acid (VITAMIN C PO) Take 1 tablet by mouth daily.   aspirin 81 MG EC tablet Take 1 tablet (81 mg total) by mouth daily. Swallow whole.   carvedilol (COREG) 3.125 MG tablet Take 1 tablet (3.125 mg total) by mouth 2 (two) times daily with a meal.   losartan (COZAAR) 25 MG tablet Take 1 tablet (25 mg total) by mouth daily.   metoprolol tartrate (LOPRESSOR) 50 MG tablet Take 1 tablet (50mg  total). 2 hours before cardiac testing.   mometasone-formoterol (DULERA) 100-5 MCG/ACT AERO Inhale 2 puffs into the lungs in the morning and at bedtime.   Multiple Vitamins-Minerals (ZINC PO) Take 1 tablet by mouth daily.   rosuvastatin (CRESTOR) 5 MG tablet Take 1 tablet (5 mg total) by mouth daily.   SPIRIVA RESPIMAT 2.5 MCG/ACT AERS Inhale 1 puff into the lungs daily at 6 (six) AM.   VENTOLIN HFA 108 (90 Base) MCG/ACT inhaler TAKE 2 PUFFS BY MOUTH EVERY 6 HOURS AS NEEDED FOR WHEEZE OR SHORTNESS OF BREATH   VITAMIN D PO Take 1 tablet by mouth daily.   VITAMIN E PO Take 1 tablet by mouth daily.   No facility-administered encounter medications on file as of 04/09/2023.    Review of Systems  Review of Systems  Constitutional: Negative.   HENT:  Positive for ear pain and facial swelling.   Cardiovascular: Negative.   Gastrointestinal:  Positive for abdominal pain.  Musculoskeletal:  Positive for arthralgias and myalgias.  Allergic/Immunologic: Negative.   Neurological: Negative.   Psychiatric/Behavioral: Negative.       Objective:   BP 127/71 (BP Location: Left Arm, Patient Position: Sitting, Cuff Size: Normal)   Pulse 76   Ht 5\' 1"  (1.549 m)   Wt 113 lb (51.3 kg)   SpO2 98%   BMI 21.35 kg/m   Wt Readings from Last 5 Encounters:  04/09/23 113 lb (51.3 kg)  03/27/23 111 lb 6.4 oz (50.5 kg)  02/23/23 110 lb 6.4 oz (50.1 kg)  01/12/23 110 lb 9.6 oz (50.2 kg)  01/08/23 110 lb 3.2 oz (50 kg)     Physical Exam Vitals and nursing note reviewed.  Constitutional:       General: She is not in acute distress.    Appearance: She is well-developed.  Cardiovascular:     Rate and Rhythm: Normal rate and regular rhythm.  Pulmonary:     Effort: Pulmonary effort is normal.     Breath sounds: Normal breath sounds.  Abdominal:     General: Abdomen is flat. Bowel sounds are normal.     Palpations: Abdomen is soft.     Tenderness: There is abdominal tenderness in the periumbilical area.     Hernia: A hernia is present. Hernia is present in the umbilical area.  Neurological:     Mental Status: She is alert and oriented to person, place, and time.       Assessment & Plan:   Umbilical hernia without obstruction and without gangrene -     Ambulatory referral to General Surgery  Left ear pain -     Ambulatory referral to ENT  Jaw pain -     Ambulatory referral to ENT  Multiple joint pain -     Rheumatoid factor; Future -     ANA; Future -     C-reactive protein; Future -     Sedimentation rate; Future     Return in about 3 months (around 07/10/2023).   Ivonne Andrew, NP 04/09/2023

## 2023-04-09 NOTE — Patient Instructions (Signed)
1. Umbilical hernia without obstruction and without gangrene  - Ambulatory referral to General Surgery  2. Left ear pain  - Ambulatory referral to ENT  3. Jaw pain  - Ambulatory referral to ENT  4. Multiple joint pain  - Rheumatoid factor; Future - ANA; Future - C-reactive protein; Future - Sedimentation Rate; Future  Follow up:  Follow up in 3 months

## 2023-04-13 ENCOUNTER — Telehealth (HOSPITAL_COMMUNITY): Payer: Self-pay | Admitting: *Deleted

## 2023-04-13 NOTE — Telephone Encounter (Signed)
Reaching out to patient to offer assistance regarding upcoming cardiac imaging study; pt verbalizes understanding of appt date/time, parking situation and where to check in, pre-test NPO status and medications ordered, and verified current allergies; name and call back number provided for further questions should they arise Hayley Sharpe RN Navigator Cardiac Imaging [facility] [phone] office [phone] cell  

## 2023-04-14 ENCOUNTER — Ambulatory Visit (HOSPITAL_COMMUNITY)
Admission: RE | Admit: 2023-04-14 | Discharge: 2023-04-14 | Disposition: A | Discharge status: Home / Self Care | Payer: Medicaid Other | Source: Ambulatory Visit | Attending: Cardiovascular Disease | Admitting: Cardiovascular Disease

## 2023-04-14 ENCOUNTER — Ambulatory Visit (HOSPITAL_BASED_OUTPATIENT_CLINIC_OR_DEPARTMENT_OTHER)
Admission: RE | Admit: 2023-04-14 | Discharge: 2023-04-14 | Disposition: A | Discharge status: Home / Self Care | Payer: Medicaid Other | Source: Ambulatory Visit | Attending: Cardiovascular Disease | Admitting: Cardiovascular Disease

## 2023-04-14 ENCOUNTER — Other Ambulatory Visit: Payer: Self-pay | Admitting: Cardiovascular Disease

## 2023-04-14 DIAGNOSIS — I2510 Atherosclerotic heart disease of native coronary artery without angina pectoris: Secondary | ICD-10-CM

## 2023-04-14 DIAGNOSIS — R931 Abnormal findings on diagnostic imaging of heart and coronary circulation: Secondary | ICD-10-CM

## 2023-04-14 DIAGNOSIS — R079 Chest pain, unspecified: Secondary | ICD-10-CM | POA: Diagnosis present

## 2023-04-14 MED ORDER — NITROGLYCERIN 0.4 MG SL SUBL
0.8000 mg | SUBLINGUAL_TABLET | Freq: Once | SUBLINGUAL | Status: AC
  Administered 2023-04-14: 0.8 mg via SUBLINGUAL

## 2023-04-14 MED ORDER — IOHEXOL 350 MG/ML SOLN
95.0000 mL | Freq: Once | INTRAVENOUS | Status: AC | PRN
  Administered 2023-04-14: 95 mL via INTRAVENOUS

## 2023-04-14 MED ORDER — NITROGLYCERIN 0.4 MG SL SUBL
SUBLINGUAL_TABLET | SUBLINGUAL | Status: AC
  Filled 2023-04-14: qty 2

## 2023-04-14 NOTE — Progress Notes (Signed)
CT FFR ordered.  Gerri Spore T. Flora Lipps, MD, [facility]  [facility]  [address], [address] [place], [place] [address] [phone]  12:42 PM

## 2023-04-15 ENCOUNTER — Other Ambulatory Visit: Payer: Medicaid Other

## 2023-04-15 DIAGNOSIS — M2550 Pain in unspecified joint: Secondary | ICD-10-CM

## 2023-04-15 NOTE — H&P (View-Only) (Signed)
Cardiology Office Note:  .   Date:  04/16/2023  ID:  Pamela Mcclain, DOB 1961-05-24, MRN [id number] PCP: Ivonne Andrew, NP  [facility] Providers Cardiologist:  Reatha Harps, MD {  History of Present Illness: Pamela Mcclain is a 62 y.o. female with history of CAD who presents for follow-up.    History of Present Illness   Pamela Mcclain, a 62 year old with a history of CAD, presents with ongoing chest pain. The discomfort typically begins in the sternal area, radiates to the neck, and then across the chest. The pain is triggered by physical exertion and does not occur at rest. The patient reports that the pain is so severe that it takes two days to clean the kitchen, a task that previously did not cause such discomfort. The patient also experiences chest tightness upon waking in the morning, which requires some time to alleviate.  The patient's recent coronary CTA revealed a severe proximal RCA stenosis, which was confirmed by FFRCT. This is a significant progression from a previous catheterization, which showed only a 40% blockage. The patient's cholesterol levels have been high, despite being on a 5mg  dose of Crestor. The patient also has a history of COPD, but there is uncertainty about whether this diagnosis is accurate. The patient is a former smoker.         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 COPD 4. HLD -T chol 276, HDL 77, LDL 182, TG 101     ROS: All other ROS reviewed and negative. Pertinent positives noted in the HPI.     Studies Reviewed: Marland Kitchen   EKG Interpretation Date/Time:  Thursday April 16 2023 14:32:27 EST Ventricular Rate:  74 PR Interval:  124 QRS Duration:  72 QT Interval:  402 QTC Calculation: 446 R Axis:   83  Text Interpretation: Normal  sinus rhythm Septal infarct , age undetermined Confirmed by Lennie Odor [phone]) on 04/16/2023 2:33:38 PM   Physical Exam:   VS:  BP 132/82 (BP Location: Left Arm, Patient Position: Sitting, Cuff Size: Normal)   Pulse 69   Ht 5\' 1"  (1.549 m)   Wt 112 lb 12.8 oz (51.2 kg)   SpO2 97%   BMI 21.31 kg/m    Wt Readings from Last 3 Encounters:  04/16/23 112 lb 12.8 oz (51.2 kg)  04/09/23 113 lb (51.3 kg)  03/27/23 111 lb 6.4 oz (50.5 kg)    GEN: Well nourished, well developed in no acute distress NECK: No JVD; No carotid bruits CARDIAC: RRR, no murmurs, rubs, gallops RESPIRATORY:  Clear to auscultation without rales, wheezing or rhonchi  ABDOMEN: Soft, non-tender, non-distended EXTREMITIES:  No edema; No deformity  ASSESSMENT AND PLAN: .   Assessment and Plan     Coronary Artery Disease (CAD) Severe proximal RCA stenosis with positive FFRCT. Experiencing chest pain with exertion and at rest, indicating unstable angina. Recent progression of stenosis from 40% to severe. -Schedule left heart catheterization with Dr. Herbie Baltimore on Tuesday, April 21, 2023. -Start nitroglycerin sublingual for chest pain, with instructions on usage provided. -Continue aspirin, carvedilol, and losartan. -Follow-up in two weeks post-catheterization.  Hyperlipidemia High cholesterol levels in August, currently on Crestor 5mg . -Increase Crestor to 20mg . -Check lipids, LPA, BMP, and CBC today.  Systolic Heart Failure (recovered ejection fraction) Currently on carvedilol  and losartan. -Continue carvedilol and losartan. -Repeat echo scheduled for first week of December.          Informed Consent   Shared Decision Making/Informed Consent The risks [stroke (1 in 1000), death (1 in 1000), kidney failure [usually temporary] (1 in 500), bleeding (1 in 200), allergic reaction [possibly serious] (1 in 200)], benefits (diagnostic support and management of coronary artery disease) and alternatives of a cardiac  catheterization were discussed in detail with Pamela Mcclain and she is willing to proceed.      Follow-up: Return in about 2 weeks (around 04/30/2023).  Time Spent with Patient: I have spent a total of 35 minutes caring for this patient today face to face, ordering and reviewing labs/tests, reviewing prior records/medical history, examining the patient, establishing an assessment and plan, communicating results/findings to the patient/family, and documenting in the medical record.   Signed, Lenna Gilford. Flora Lipps, MD, [facility]  [facility]  [address] [place], [place] [address] [phone]  2:54 PM

## 2023-04-15 NOTE — Progress Notes (Unsigned)
Cardiology Office Note:  .   Date:  04/16/2023  ID:  Pamela Mcclain, DOB 1961-05-16, MRN [id number] PCP: Ivonne Andrew, NP  [facility] Providers Cardiologist:  Reatha Harps, MD {  History of Present Illness: Pamela Mcclain is a 62 y.o. female with history of CAD who presents for follow-up.    History of Present Illness   Pamela Mcclain, a 62 year old with a history of CAD, presents with ongoing chest pain. The discomfort typically begins in the sternal area, radiates to the neck, and then across the chest. The pain is triggered by physical exertion and does not occur at rest. The patient reports that the pain is so severe that it takes two days to clean the kitchen, a task that previously did not cause such discomfort. The patient also experiences chest tightness upon waking in the morning, which requires some time to alleviate.  The patient's recent coronary CTA revealed a severe proximal RCA stenosis, which was confirmed by FFRCT. This is a significant progression from a previous catheterization, which showed only a 40% blockage. The patient's cholesterol levels have been high, despite being on a 5mg  dose of Crestor. The patient also has a history of COPD, but there is uncertainty about whether this diagnosis is accurate. The patient is a former smoker.         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 COPD 4. HLD -T chol 276, HDL 77, LDL 182, TG 101     ROS: All other ROS reviewed and negative. Pertinent positives noted in the HPI.     Studies Reviewed: Marland Kitchen   EKG Interpretation Date/Time:  Thursday April 16 2023 14:32:27 EST Ventricular Rate:  74 PR Interval:  124 QRS Duration:  72 QT Interval:  402 QTC Calculation: 446 R Axis:   83  Text Interpretation: Normal  sinus rhythm Septal infarct , age undetermined Confirmed by Lennie Odor [phone]) on 04/16/2023 2:33:38 PM   Physical Exam:   VS:  BP 132/82 (BP Location: Left Arm, Patient Position: Sitting, Cuff Size: Normal)   Pulse 69   Ht 5\' 1"  (1.549 m)   Wt 112 lb 12.8 oz (51.2 kg)   SpO2 97%   BMI 21.31 kg/m    Wt Readings from Last 3 Encounters:  04/16/23 112 lb 12.8 oz (51.2 kg)  04/09/23 113 lb (51.3 kg)  03/27/23 111 lb 6.4 oz (50.5 kg)    GEN: Well nourished, well developed in no acute distress NECK: No JVD; No carotid bruits CARDIAC: RRR, no murmurs, rubs, gallops RESPIRATORY:  Clear to auscultation without rales, wheezing or rhonchi  ABDOMEN: Soft, non-tender, non-distended EXTREMITIES:  No edema; No deformity  ASSESSMENT AND PLAN: .   Assessment and Plan     Coronary Artery Disease (CAD) Severe proximal RCA stenosis with positive FFRCT. Experiencing chest pain with exertion and at rest, indicating unstable angina. Recent progression of stenosis from 40% to severe. -Schedule left heart catheterization with Dr. Herbie Baltimore on Tuesday, April 21, 2023. -Start nitroglycerin sublingual for chest pain, with instructions on usage provided. -Continue aspirin, carvedilol, and losartan. -Follow-up in two weeks post-catheterization.  Hyperlipidemia High cholesterol levels in August, currently on Crestor 5mg . -Increase Crestor to 20mg . -Check lipids, LPA, BMP, and CBC today.  Systolic Heart Failure (recovered ejection fraction) Currently on carvedilol  and losartan. -Continue carvedilol and losartan. -Repeat echo scheduled for first week of December.          Informed Consent   Shared Decision Making/Informed Consent The risks [stroke (1 in 1000), death (1 in 1000), kidney failure [usually temporary] (1 in 500), bleeding (1 in 200), allergic reaction [possibly serious] (1 in 200)], benefits (diagnostic support and management of coronary artery disease) and alternatives of a cardiac  catheterization were discussed in detail with Ms. Morando and she is willing to proceed.      Follow-up: Return in about 2 weeks (around 04/30/2023).  Time Spent with Patient: I have spent a total of 35 minutes caring for this patient today face to face, ordering and reviewing labs/tests, reviewing prior records/medical history, examining the patient, establishing an assessment and plan, communicating results/findings to the patient/family, and documenting in the medical record.   Signed, Lenna Gilford. Flora Lipps, MD, [facility]  [facility]  [address] [place], [place] [address] [phone]  2:54 PM

## 2023-04-16 ENCOUNTER — Encounter: Payer: Self-pay | Admitting: Cardiovascular Disease

## 2023-04-16 ENCOUNTER — Ambulatory Visit: Payer: Medicaid Other | Attending: Cardiovascular Disease | Admitting: Cardiovascular Disease

## 2023-04-16 VITALS — BP 132/82 | HR 69 | Ht 61.0 in | Wt 112.8 lb

## 2023-04-16 DIAGNOSIS — I25110 Atherosclerotic heart disease of native coronary artery with unstable angina pectoris: Secondary | ICD-10-CM

## 2023-04-16 DIAGNOSIS — Z01812 Encounter for preprocedural laboratory examination: Secondary | ICD-10-CM

## 2023-04-16 DIAGNOSIS — I10 Essential (primary) hypertension: Secondary | ICD-10-CM | POA: Diagnosis not present

## 2023-04-16 DIAGNOSIS — R0602 Shortness of breath: Secondary | ICD-10-CM | POA: Diagnosis not present

## 2023-04-16 DIAGNOSIS — E782 Mixed hyperlipidemia: Secondary | ICD-10-CM | POA: Diagnosis not present

## 2023-04-16 DIAGNOSIS — I5022 Chronic systolic (congestive) heart failure: Secondary | ICD-10-CM

## 2023-04-16 LAB — ANA: Anti Nuclear Antibody (ANA): NEGATIVE

## 2023-04-16 LAB — RHEUMATOID FACTOR: Rheumatoid fact SerPl-aCnc: 13.8 [IU]/mL (ref <14.0)

## 2023-04-16 LAB — SEDIMENTATION RATE: Sed Rate: 7 mm/h (ref 0–40)

## 2023-04-16 LAB — C-REACTIVE PROTEIN: CRP: 2 mg/L (ref 0–10)

## 2023-04-16 MED ORDER — NITROGLYCERIN 0.4 MG SL SUBL: 0.4000 mg | SUBLINGUAL_TABLET | SUBLINGUAL | 3 refills | Status: AC | PRN

## 2023-04-16 NOTE — Patient Instructions (Addendum)
Medication Instructions:  - Start Nitroglycerin TAKE 1 TABLET (0.4MG ) EVERY AS NEEDED FOR CHEST PAIN   *If you need a refill on your cardiac medications before your next appointment, please call your pharmacy*   Lab Work: - CBC -BMET -Lpa  -LIPIDS    If you have labs (blood work) drawn today and your tests are completely normal, you will receive your results only by: MyChart Message (if you have MyChart) OR A paper copy in the mail If you have any lab test that is abnormal or we need to change your treatment, we will call you to review the results.   Testing/Procedures:  [place] A DEPT OF [facility] AT [facility] [address] [place] [address] Dept: [phone] Loc: [phone]  Pamela Mcclain  04/16/2023  You are scheduled for a Cardiac Catheterization on Tuesday, December 3 with Dr. Bryan Lemma.  1. Please arrive at the [facility] (Main Entrance A) at [facility]: [address] [place], [place] [address] at 5:30 AM (This time is 2 hour(s) before your procedure to ensure your preparation).   Free valet parking service is available. You will check in at ADMITTING. The support person will be asked to wait in the waiting room.  It is OK to have someone drop you off and come back when you are ready to be discharged.    Special note: Every effort is made to have your procedure done on time. Please understand that emergencies sometimes delay scheduled procedures.  2. Diet: Do not eat solid foods after midnight.  The patient may have clear liquids until 5am upon the day of the procedure.  3. Labs: You will need to have blood drawn on TODAY: - LIPIDS -BMET -Lpa -CBC  4. Medication instructions in preparation for your procedure:   Contrast Allergy: No     On the morning of your procedure, take your Aspirin 81 mg and any morning medicines NOT listed above.  You may use  sips of water.  5. Plan to go home the same day, you will only stay overnight if medically necessary. 6. Bring a current list of your medications and current insurance cards. 7. You MUST have a responsible person to drive you home. 8. Someone MUST be with you the first 24 hours after you arrive home or your discharge will be delayed. 9. Please wear clothes that are easy to get on and off and wear slip-on shoes.  Thank you for allowing [place] to care for you!   -- [facility]    Follow-Up: At [facility], you and your health needs are our priority.  As part of our continuing mission to provide you with exceptional heart care, we have created designated Provider Care Teams.  These Care Teams include your primary Cardiologist (physician) and Advanced Practice Providers (APPs -  Physician Assistants and Nurse Practitioners) who all work together to provide you with the care you need, when you need it.  We recommend signing up for the patient portal called "MyChart".  Sign up information is provided on this After Visit Summary.  MyChart is used to connect with patients for Virtual Visits (Telemedicine).  Patients are able to view lab/test results, encounter notes, upcoming appointments, etc.  Non-urgent messages can be sent to your provider as well.   To learn more about what you can do with MyChart, go to [email or web address].    Your next appointment:  2 week(s): May 11, 2023 AT 9:00AM   The format for your next appointment:   In Person  Provider:   Reatha Harps, MD    Other Instructions

## 2023-04-17 MED ORDER — ROSUVASTATIN CALCIUM 20 MG PO TABS: 20.0000 mg | ORAL_TABLET | Freq: Every day | ORAL | 3 refills | Status: DC

## 2023-04-17 NOTE — Telephone Encounter (Signed)
Spoke to patient and patient reports she did not know she could used the 5 mg tablet. Advised to take  4 of the 5 mg tablets of Crestor totaling 20 mg until finished.  Will send new prescription. Patient in agreement. Made patient aware if she needs any other assistance to call office.

## 2023-04-18 LAB — LIPID PANEL
Chol/HDL Ratio: 2.5 ratio (ref 0.0–4.4)
Cholesterol, Total: 178 mg/dL (ref 100–199)
HDL: 72 mg/dL (ref >39)
LDL Chol Calc (NIH): 90 mg/dL (ref 0–99)
Triglycerides: 90 mg/dL (ref 0–149)
VLDL Cholesterol Cal: 16 mg/dL (ref 5–40)

## 2023-04-18 LAB — LIPOPROTEIN A (LPA): Lipoprotein (a): 16.1 nmol/L

## 2023-04-18 LAB — BASIC METABOLIC PANEL
BUN/Creatinine Ratio: 15 (ref 12–28)
BUN: 13 mg/dL (ref 8–27)
CO2: 22 mmol/L (ref 20–29)
Calcium: 8.9 mg/dL (ref 8.7–10.3)
Chloride: 105 mmol/L (ref 96–106)
Creatinine, Ser: 0.86 mg/dL (ref 0.57–1.00)
Glucose: 86 mg/dL (ref 70–99)
Potassium: 4.4 mmol/L (ref 3.5–5.2)
Sodium: 142 mmol/L (ref 134–144)
eGFR: 75 mL/min/{1.73_m2} (ref >59)

## 2023-04-18 LAB — CBC
Hematocrit: 39.5 % (ref 34.0–46.6)
Hemoglobin: 12.8 g/dL (ref 11.1–15.9)
MCH: 30.5 pg (ref 26.6–33.0)
MCHC: 32.4 g/dL (ref 31.5–35.7)
MCV: 94 fL (ref 79–97)
Platelets: 239 10*3/uL (ref 150–450)
RBC: 4.2 x10E6/uL (ref 3.77–5.28)
RDW: 12.4 % (ref 11.7–15.4)
WBC: 5.1 10*3/uL (ref 3.4–10.8)

## 2023-04-20 ENCOUNTER — Other Ambulatory Visit: Payer: Self-pay

## 2023-04-20 ENCOUNTER — Ambulatory Visit: Payer: Medicaid Other | Admitting: Nurse Practitioner

## 2023-04-20 ENCOUNTER — Encounter: Payer: Self-pay | Admitting: Internal Medicine

## 2023-04-27 ENCOUNTER — Telehealth: Payer: Self-pay | Admitting: *Deleted

## 2023-04-27 NOTE — Telephone Encounter (Signed)
Cardiac Catheterization scheduled at [facility] for: Tuesday April 28, 2023 7:30 AM Arrival time [facility] Main Entrance A at: 5:30 AM  Nothing to eat after midnight prior to procedure, clear liquids until 5 AM day of procedure.  Medication instructions: -Usual morning medications can be taken with sips of water including aspirin 81 mg.  Plan to go home the same day, you will only stay overnight if medically necessary.  You must have responsible adult to drive you home.  Someone must be with you the first 24 hours after you arrive home.  Procedure instructions reviewed with patient.

## 2023-04-28 ENCOUNTER — Other Ambulatory Visit: Payer: Self-pay

## 2023-04-28 ENCOUNTER — Ambulatory Visit (HOSPITAL_COMMUNITY)
Admission: RE | Admit: 2023-04-28 | Discharge: 2023-04-28 | Disposition: A | Discharge status: Home / Self Care | Payer: Medicaid Other | Attending: Cardiology | Admitting: Cardiology

## 2023-04-28 ENCOUNTER — Other Ambulatory Visit (HOSPITAL_COMMUNITY): Payer: Self-pay

## 2023-04-28 ENCOUNTER — Encounter (HOSPITAL_COMMUNITY)
Admission: RE | Disposition: A | Discharge status: Home / Self Care | Payer: Self-pay | Source: Home / Self Care | Attending: Cardiology

## 2023-04-28 ENCOUNTER — Telehealth (HOSPITAL_COMMUNITY): Payer: Self-pay | Admitting: Pharmacy Technician

## 2023-04-28 DIAGNOSIS — I10 Essential (primary) hypertension: Secondary | ICD-10-CM | POA: Diagnosis present

## 2023-04-28 DIAGNOSIS — I5181 Takotsubo syndrome: Secondary | ICD-10-CM | POA: Diagnosis not present

## 2023-04-28 DIAGNOSIS — I5040 Unspecified combined systolic (congestive) and diastolic (congestive) heart failure: Secondary | ICD-10-CM | POA: Insufficient documentation

## 2023-04-28 DIAGNOSIS — Z87891 Personal history of nicotine dependence: Secondary | ICD-10-CM | POA: Diagnosis not present

## 2023-04-28 DIAGNOSIS — I4891 Unspecified atrial fibrillation: Secondary | ICD-10-CM | POA: Clinically undetermined

## 2023-04-28 DIAGNOSIS — E7800 Pure hypercholesterolemia, unspecified: Secondary | ICD-10-CM | POA: Insufficient documentation

## 2023-04-28 DIAGNOSIS — R931 Abnormal findings on diagnostic imaging of heart and coronary circulation: Secondary | ICD-10-CM | POA: Diagnosis not present

## 2023-04-28 DIAGNOSIS — J449 Chronic obstructive pulmonary disease, unspecified: Secondary | ICD-10-CM | POA: Diagnosis not present

## 2023-04-28 DIAGNOSIS — I200 Unstable angina: Secondary | ICD-10-CM | POA: Diagnosis present

## 2023-04-28 DIAGNOSIS — I25110 Atherosclerotic heart disease of native coronary artery with unstable angina pectoris: Secondary | ICD-10-CM | POA: Diagnosis not present

## 2023-04-28 DIAGNOSIS — Z955 Presence of coronary angioplasty implant and graft: Secondary | ICD-10-CM

## 2023-04-28 HISTORY — PX: CORONARY STENT INTERVENTION: CATH118234

## 2023-04-28 HISTORY — PX: LEFT HEART CATH AND CORONARY ANGIOGRAPHY: CATH118249

## 2023-04-28 LAB — POCT ACTIVATED CLOTTING TIME
Activated Clotting Time: 441 s
Activated Clotting Time: 360 s

## 2023-04-28 SURGERY — LEFT HEART CATH AND CORONARY ANGIOGRAPHY
Anesthesia: LOCAL

## 2023-04-28 MED ORDER — SODIUM CHLORIDE 0.9% FLUSH: 3.0000 mL | INTRAVENOUS | Status: DC | PRN

## 2023-04-28 MED ORDER — ASPIRIN 81 MG PO TBEC
81.0000 mg | DELAYED_RELEASE_TABLET | Freq: Every day | ORAL | 0 refills | Status: DC
  Filled 2023-04-28: qty 30, 30d supply, fill #0

## 2023-04-28 MED ORDER — SODIUM CHLORIDE 0.9 % IV SOLN: 250.0000 mL | INTRAVENOUS | Status: DC | PRN

## 2023-04-28 MED ORDER — LABETALOL HCL 5 MG/ML IV SOLN: 10.0000 mg | INTRAVENOUS | Status: DC | PRN

## 2023-04-28 MED ORDER — VERAPAMIL HCL 2.5 MG/ML IV SOLN
INTRAVENOUS | Status: DC | PRN
  Administered 2023-04-28: 10 mL via INTRA_ARTERIAL

## 2023-04-28 MED ORDER — HEPARIN SODIUM (PORCINE) 1000 UNIT/ML IJ SOLN
INTRAMUSCULAR | Status: DC | PRN
  Administered 2023-04-28 (×2): 3500 [IU] via INTRAVENOUS

## 2023-04-28 MED ORDER — VERAPAMIL HCL 2.5 MG/ML IV SOLN
INTRAVENOUS | Status: AC
  Filled 2023-04-28: qty 2

## 2023-04-28 MED ORDER — SODIUM CHLORIDE 0.9 % WEIGHT BASED INFUSION: 1.0000 mL/kg/h | INTRAVENOUS | Status: DC

## 2023-04-28 MED ORDER — SODIUM CHLORIDE 0.9% FLUSH: 3.0000 mL | Freq: Two times a day (BID) | INTRAVENOUS | Status: DC

## 2023-04-28 MED ORDER — MIDAZOLAM HCL 2 MG/2ML IJ SOLN
INTRAMUSCULAR | Status: DC | PRN
  Administered 2023-04-28: 2 mg via INTRAVENOUS
  Administered 2023-04-28 (×2): 1 mg via INTRAVENOUS

## 2023-04-28 MED ORDER — HEPARIN (PORCINE) IN NACL 1000-0.9 UT/500ML-% IV SOLN
INTRAVENOUS | Status: DC | PRN
  Administered 2023-04-28 (×2): 500 mL

## 2023-04-28 MED ORDER — LIDOCAINE HCL (PF) 1 % IJ SOLN
INTRAMUSCULAR | Status: AC
  Filled 2023-04-28: qty 30

## 2023-04-28 MED ORDER — FAMOTIDINE 20 MG PO TABS
40.0000 mg | ORAL_TABLET | Freq: Once | ORAL | Status: AC
  Administered 2023-04-28: 40 mg via ORAL
  Filled 2023-04-28 (×2): qty 2

## 2023-04-28 MED ORDER — LABETALOL HCL 5 MG/ML IV SOLN
INTRAVENOUS | Status: AC
Start: 2023-04-28 — End: ?
  Filled 2023-04-28: qty 4

## 2023-04-28 MED ORDER — FENTANYL CITRATE (PF) 100 MCG/2ML IJ SOLN
INTRAMUSCULAR | Status: AC
  Filled 2023-04-28: qty 2

## 2023-04-28 MED ORDER — LIDOCAINE HCL (PF) 1 % IJ SOLN
INTRAMUSCULAR | Status: DC | PRN
  Administered 2023-04-28: 2 mL

## 2023-04-28 MED ORDER — ASPIRIN 81 MG PO CHEW: 81.0000 mg | CHEWABLE_TABLET | ORAL | Status: DC

## 2023-04-28 MED ORDER — CLOPIDOGREL BISULFATE 75 MG PO TABS
ORAL_TABLET | ORAL | Status: AC
  Filled 2023-04-28: qty 2

## 2023-04-28 MED ORDER — ONDANSETRON HCL 4 MG/2ML IJ SOLN: 4.0000 mg | Freq: Four times a day (QID) | INTRAMUSCULAR | Status: DC | PRN

## 2023-04-28 MED ORDER — FENTANYL CITRATE (PF) 100 MCG/2ML IJ SOLN
INTRAMUSCULAR | Status: DC | PRN
  Administered 2023-04-28 (×3): 25 ug via INTRAVENOUS
  Administered 2023-04-28: 50 ug via INTRAVENOUS
  Administered 2023-04-28: 25 ug via INTRAVENOUS

## 2023-04-28 MED ORDER — SODIUM CHLORIDE 0.9 % WEIGHT BASED INFUSION
3.0000 mL/kg/h | INTRAVENOUS | Status: AC
  Administered 2023-04-28: 3 mL/kg/h via INTRAVENOUS

## 2023-04-28 MED ORDER — MIDAZOLAM HCL 2 MG/2ML IJ SOLN
INTRAMUSCULAR | Status: AC
  Filled 2023-04-28: qty 2

## 2023-04-28 MED ORDER — HYDRALAZINE HCL 20 MG/ML IJ SOLN: 10.0000 mg | INTRAMUSCULAR | Status: DC | PRN

## 2023-04-28 MED ORDER — HEPARIN SODIUM (PORCINE) 1000 UNIT/ML IJ SOLN
INTRAMUSCULAR | Status: AC
  Filled 2023-04-28: qty 10

## 2023-04-28 MED ORDER — ACETAMINOPHEN 325 MG PO TABS: 650.0000 mg | ORAL_TABLET | ORAL | Status: DC | PRN

## 2023-04-28 MED ORDER — CLOPIDOGREL BISULFATE 300 MG PO TABS
ORAL_TABLET | ORAL | Status: DC | PRN
  Administered 2023-04-28: 600 mg via ORAL

## 2023-04-28 MED ORDER — PANTOPRAZOLE SODIUM 40 MG PO TBEC
40.0000 mg | DELAYED_RELEASE_TABLET | Freq: Every day | ORAL | 1 refills | Status: DC
  Filled 2023-04-28: qty 30, 30d supply, fill #0
  Filled 2023-05-17: qty 30, 30d supply, fill #1
  Filled 2023-05-21 (×2): qty 30, 30d supply, fill #0

## 2023-04-28 MED ORDER — MORPHINE SULFATE (PF) 2 MG/ML IV SOLN: 1.0000 mg | INTRAVENOUS | Status: DC | PRN

## 2023-04-28 MED ORDER — APIXABAN 5 MG PO TABS
5.0000 mg | ORAL_TABLET | Freq: Two times a day (BID) | ORAL | 11 refills | Status: DC
  Filled 2023-04-28: qty 60, 30d supply, fill #0
  Filled 2023-05-17: qty 60, 30d supply, fill #1
  Filled 2023-05-21 (×2): qty 60, 30d supply, fill #0

## 2023-04-28 MED ORDER — LABETALOL HCL 5 MG/ML IV SOLN
INTRAVENOUS | Status: DC | PRN
  Administered 2023-04-28: 10 mg via INTRAVENOUS

## 2023-04-28 MED ORDER — CLOPIDOGREL BISULFATE 75 MG PO TABS
75.0000 mg | ORAL_TABLET | Freq: Every day | ORAL | Status: DC
Start: 2023-04-29 — End: 2023-04-28

## 2023-04-28 MED ORDER — CLOPIDOGREL BISULFATE 75 MG PO TABS
75.0000 mg | ORAL_TABLET | Freq: Every day | ORAL | 1 refills | Status: DC
  Filled 2023-04-28: qty 90, 90d supply, fill #0

## 2023-04-28 SURGICAL SUPPLY — 20 items
BALL SAPPHIRE NC24 3.25X18 (BALLOONS) ×1
BALLN EMERGE MR 2.5X15 (BALLOONS) ×1
BALLOON EMERGE MR 2.5X15 (BALLOONS) IMPLANT
BALLOON SAPPHIRE NC24 3.25X18 (BALLOONS) IMPLANT
CATH INFINITI AMBI 5FR TG (CATHETERS) IMPLANT
CATH LAUNCHER 5F JR4 (CATHETERS) IMPLANT
DEVICE RAD COMP TR BAND LRG (VASCULAR PRODUCTS) IMPLANT
GLIDESHEATH SLEND SS 6F .021 (SHEATH) IMPLANT
GUIDEWIRE INQWIRE 1.5J.035X260 (WIRE) IMPLANT
INQWIRE 1.5J .035X260CM (WIRE) ×1
KIT ENCORE 26 ADVANTAGE (KITS) IMPLANT
KIT HEMO VALVE WATCHDOG (MISCELLANEOUS) IMPLANT
KIT SYRINGE INJ CVI SPIKEX1 (MISCELLANEOUS) IMPLANT
PACK CARDIAC CATHETERIZATION (CUSTOM PROCEDURE TRAY) ×1 IMPLANT
SET ATX-X65L (MISCELLANEOUS) IMPLANT
SHEATH PROBE COVER 6X72 (BAG) IMPLANT
STENT SYNERGY XD 3.0X20 (Permanent Stent) IMPLANT
SYNERGY XD 3.0X20 (Permanent Stent) ×1 IMPLANT
TUBING CIL FLEX 10 FLL-RA (TUBING) IMPLANT
WIRE ASAHI PROWATER 180CM (WIRE) IMPLANT

## 2023-04-28 NOTE — Progress Notes (Signed)
Pt was educated on stent card, stent location, Antiplatelet and ASA use, wt restrictions, no baths/daily wash-ups, s/s of infection, ex guidelines, s/s to stop exercising, NTG use and calling 911, heart healthy diet, risk factors and CRPII. Pt received materials on exercise, diet, and CRPII. Will refer to [facility].   Pamela Mcclain 04/28/2023 11:53 AM  [id number]

## 2023-04-28 NOTE — Telephone Encounter (Signed)
Patient Product/process development scientist completed.    The patient is insured through [facility] MEDICAID.     Ran test claim for Eliquis 5 mg and the current 30 day co-pay is $4.00.   This test claim was processed through [facility]- copay amounts may vary at other pharmacies due to pharmacy/plan contracts, or as the patient moves through the different stages of their insurance plan.     Roland Earl, CPHT Pharmacy Technician III Certified Patient Advocate [facility] Patient Advocate Team Direct Number: [phone]  Fax: [phone]

## 2023-04-28 NOTE — Interval H&P Note (Signed)
History and Physical Interval Note:  04/28/2023 7:34 AM  Pamela Mcclain  has presented today for surgery, with the diagnosis of Unstable Angina / Abnormal Coronary CT Angiography.  The various methods of treatment have been discussed with the patient and family. After consideration of risks, benefits and other options for treatment, the patient has consented to  Procedure(s): LEFT HEART CATH AND CORONARY ANGIOGRAPHY (N/A)  PERCUTANEOUS CORONARY INTERVENTION  as a surgical intervention.  The patient's history has been reviewed, patient examined, no change in status, stable for surgery.  I have reviewed the patient's chart and labs.  Questions were answered to the patient's satisfaction.    Cath Lab Visit (complete for each Cath Lab visit)  Clinical Evaluation Leading to the Procedure:   ACS: No.  Non-ACS:    Anginal Classification: CCS IV  Anti-ischemic medical therapy: Minimal Therapy (1 class of medications)  Non-Invasive Test Results: High-risk stress test findings: cardiac mortality >3%/year  Prior CABG: No previous CABG   Bryan Lemma

## 2023-04-28 NOTE — Discharge Instructions (Signed)
Drink plenty of fluids for 48 hours and keep wrist elevated at heart level for 24 hours  Radial Site Care   This sheet gives you information about how to care for yourself after your procedure. Your health care provider may also give you more specific instructions. If you have problems or questions, contact your health care provider. What can I expect after the procedure? After the procedure, it is common to have: Bruising and tenderness at the catheter insertion area. Follow these instructions at home: Medicines Take over-the-counter and prescription medicines only as told by your health care provider. Insertion site care Follow instructions from your health care provider about how to take care of your insertion site. Make sure you: Wash your hands with soap and water before you change your bandage (dressing). If soap and water are not available, use hand sanitizer. Remove your dressing tomorrow (04/29/23) at 3:00 pm Check your insertion site every day for signs of infection. Check for: Redness, swelling, or pain. Fluid or blood. Pus or a bad smell. Warmth. Do not take baths, swim, or use a hot tub until your health care provider approves. You may shower after you remove your dressing. Pat the area dry with a clean towel. Do not rub the site. That could cause bleeding. Do not apply powder or lotion to the site. Activity   For 24 hours after the procedure, or as directed by your health care provider: Do not push or pull heavy objects with the affected arm. Do not drive yourself home from the hospital or clinic. You may drive 24 hours after the procedure unless your health care provider tells you not to. Do not operate machinery or power tools. Do not lift anything that is heavier than 10 lb (4.5 kg), or the limit that you are told, until your health care provider says that it is safe.  For 4 days Ask your health care provider when it is okay to: Return to work or school. Resume  usual physical activities or sports. Resume sexual activity. General instructions If the catheter site starts to bleed, raise your arm and put firm pressure on the site for 15 minutes. If the bleeding does not stop, get help right away. This is a medical emergency. If you went home on the same day as your procedure, a responsible adult should be with you for the first 24 hours after you arrive home. Keep all follow-up visits as told by your health care provider. This is important. Contact a health care provider if: You have a fever. You have redness, swelling, or yellow drainage around your insertion site. Get help right away if: You have unusual pain at the radial site. The catheter insertion area swells very fast raise your arm and put firm pressure on the site for 15 minutes. The insertion area is bleeding, and the bleeding does not stop when you hold steady pressure on the area for 15 minutes. Your arm or hand becomes pale, cool, tingly, or numb. These symptoms may represent a serious problem that is an emergency. Do not wait to see if the symptoms will go away. Get medical help right away. Call your local emergency services (911 in the [place] Do not drive yourself to the hospital. Summary After the procedure, it is common to have bruising and tenderness at the site. Follow instructions from your health care provider about how to take care of your radial site wound. Check the wound every day for signs of infection. Do not lift  anything that is heavier than 10 lb (4.5 kg), or the limit that you are told, until your health care provider says that it is safe. This information is not intended to replace advice given to you by your health care provider. Make sure you discuss any questions you have with your health care provider. Document Revised: 06/17/2017 Document Reviewed: 06/17/2017 Elsevier Patient Education  2020 [employer].

## 2023-04-28 NOTE — Discharge Summary (Signed)
Discharge Summary for Same Day PCI   Patient ID: Pamela Mcclain MRN: [id number]; DOB: 1960/06/05  Admit date: 04/28/2023 Discharge date: 04/28/2023  Primary Care Provider: Ivonne Andrew, NP  Primary Cardiologist: Reatha Harps, MD  Primary Electrophysiologist:  None   Discharge Diagnoses    Principal Problem:   Coronary artery disease involving native coronary artery of native heart with unstable angina pectoris [facility]) Active Problems:   Hypertension   Presence of drug coated stent in right coronary artery   Abnormal cardiac CT angiography   New onset atrial fibrillation [facility])   Diagnostic Studies/Procedures    Cardiac Catheterization 04/28/2023:    Prox RCA to Mid RCA lesion is 90% stenosed.  (TIMI-3 flow)   A drug-eluting stent was successfully placed using a SYNERGY XD 3.0X20 => postdilated to 3.3 mm; Post intervention, there is a 0% residual stenosis with TIMI-3 flow maintained.   RPAV lesion is 35% stenosed.   Ost LM to Mid LM lesion is 20% stenosed.   LV end diastolic pressure is normal.   There is no aortic valve stenosis.   POST-CATH DIAGNOSES Severe single-vessel CAD with 90% mid RCA stenosis and a large dominant RCA with extensive RPL system and wraparound PDA Successful DES PCI of the RCA lesion reduced 90% to 0% maintaining TIMI-3 flow: Synergy XD 3.0 mm x 20 mm postdilated to 3.3 mm Small caliber LCA with trivial LCx, LAD with 2 small diagonal branches Normal LVEDP (LV gram not performed due to radial artery spasm precluding advancement of another catheter) New diagnosis of atrial fibrillation noted during procedure and confirmed on post cath EKG     RECOMMENDATIONS   In the absence of any other complications or medical issues, we expect the patient to be ready for discharge from an interventional cardiology perspective on 04/28/2023.   With new diagnosis of atrial fibrillation, recommend to initiate apixaban, at currently prescribed dose and frequency p.m.  of 04/28/2023 (at least 6 hours post TR band removal)  Recommend concurrent antiplatelet therapy of Aspirin 81 mg for 1 month and Clopidogrel 75mg  daily for 6 months   Recommend continued titration of cardiac medications.   Follow-up with Dr. Lennie Odor or APP.   Bryan Lemma, MD   Diagnostic Dominance: Right  Intervention   _____________   History of Present Illness     Pamela Mcclain is a 62 y.o. female with PMH of takotsubo cardiomyopathy, COPD, HLD and abnormal CCTa who presented to the office on 11/21 for follow up.  She was initially seen for chest tightness that would begin in the sternal area, radiated to the neck, and then across the chest. The pain was triggered by physical exertion and did not occur at rest. The patient reported that the pain was so severe that it would take two days to clean the kitchen, a task that previously did not cause such discomfort. She was set up for outpatient coronary CTa. This revealed a severe proximal RCA stenosis, which was confirmed by FFRCT. This was a significant progression from a previous catheterization, which showed only a 40% blockage. The patient's cholesterol levels had been high, despite being on a 5mg  dose of Crestor. The patient also has a history of COPD, but there is uncertainty about whether this diagnosis is accurate. The patient is a former smoker. When seen back in the office on 11/21 she was set up for outpatient cardiac cath.   Hospital Course     The patient underwent cardiac cath as noted  above with severe single vessel CAD of 90% mRCA stenosis treated with PCI/DES x1. She was noted to be in new onset atrial fibrillation during case. Plan for TRIPLE therapy with ASA/plavix/eliquis for one month, then drop ASA with continuation of plavix/eliquis. The patient was seen by cardiac rehab while in short stay. There were no observed complications post cath. Radial cath site was re-evaluated prior to discharge and found to be stable  without any complications. Instructions/precautions regarding cath site care were given prior to discharge.  Adamae Folds was seen by Dr. Herbie Baltimore and determined stable for discharge home. Follow up with our office has been arranged. Medications are listed below. Pertinent changes include addition of ASA, plavix, eliquis and protonix.    _____________  Cath/PCI Registry Performance & Quality Measures: Aspirin prescribed? - Yes ADP Receptor Inhibitor (Plavix/Clopidogrel, Brilinta/Ticagrelor or Effient/Prasugrel) prescribed (includes medically managed patients)? - Yes High Intensity Statin (Lipitor 40-80mg  or Crestor 20-40mg ) prescribed? - Yes For EF <40%, was ACEI/ARB prescribed? - Not Applicable (EF >/= 40%) For EF <40%, Aldosterone Antagonist (Spironolactone or Eplerenone) prescribed? - Not Applicable (EF >/= 40%) Cardiac Rehab Phase II ordered (Included Medically managed Patients)? - Yes  _____________   Discharge Vitals Blood pressure (!) 157/87, pulse 67, temperature 98 F (36.7 C), temperature source Oral, resp. rate 18, height 5\' 1"  (1.549 m), weight 50.8 kg, SpO2 100%.  Filed Weights   04/28/23 0545  Weight: 50.8 kg    Last Labs & Radiologic Studies    CBC No results for input(s): "WBC", "NEUTROABS", "HGB", "HCT", "MCV", "PLT" in the last 72 hours. Basic Metabolic Panel No results for input(s): "NA", "K", "CL", "CO2", "GLUCOSE", "BUN", "CREATININE", "CALCIUM", "MG", "PHOS" in the last 72 hours. Liver Function Tests No results for input(s): "AST", "ALT", "ALKPHOS", "BILITOT", "PROT", "ALBUMIN" in the last 72 hours. No results for input(s): "LIPASE", "AMYLASE" in the last 72 hours. High Sensitivity Troponin:   No results for input(s): "TROPONINIHS" in the last 720 hours.  BNP Invalid input(s): "POCBNP" D-Dimer No results for input(s): "DDIMER" in the last 72 hours. Hemoglobin A1C No results for input(s): "HGBA1C" in the last 72 hours. Fasting Lipid Panel No results  for input(s): "CHOL", "HDL", "LDLCALC", "TRIG", "CHOLHDL", "LDLDIRECT" in the last 72 hours. Thyroid Function Tests No results for input(s): "TSH", "T4TOTAL", "T3FREE", "THYROIDAB" in the last 72 hours.  Invalid input(s): "FREET3" _____________  CARDIAC CATHETERIZATION  Result Date: 04/28/2023   Prox RCA to Mid RCA lesion is 90% stenosed.  (TIMI-3 flow)   A drug-eluting stent was successfully placed using a SYNERGY XD 3.0X20 => postdilated to 3.3 mm; Post intervention, there is a 0% residual stenosis with TIMI-3 flow maintained.   RPAV lesion is 35% stenosed.   Ost LM to Mid LM lesion is 20% stenosed.   LV end diastolic pressure is normal.   There is no aortic valve stenosis. POST-CATH DIAGNOSES Severe single-vessel CAD with 90% mid RCA stenosis and a large dominant RCA with extensive RPL system and wraparound PDA Successful DES PCI of the RCA lesion reduced 90% to 0% maintaining TIMI-3 flow: Synergy XD 3.0 mm x 20 mm postdilated to 3.3 mm Small caliber LCA with trivial LCx, LAD with 2 small diagonal branches Normal LVEDP (LV gram not performed due to radial artery spasm precluding advancement of another catheter) New diagnosis of atrial fibrillation noted during procedure and confirmed on post cath EKG RECOMMENDATIONS   In the absence of any other complications or medical issues, we expect the patient to be ready  for discharge from an interventional cardiology perspective on 04/28/2023.   With new diagnosis of atrial fibrillation, recommend to initiate apixaban, at currently prescribed dose and frequency p.m. of 04/28/2023 (at least 6 hours post TR band removal)  Recommend concurrent antiplatelet therapy of Aspirin 81 mg for 1 month and Clopidogrel 75mg  daily for 6 months  Recommend continued titration of cardiac medications. Follow-up with Dr. Lennie Odor or APP. Bryan Lemma, MD  CT CORONARY FFR DATA PREP & FLUID ANALYSIS  Result Date: 04/14/2023 EXAM: CT FFR analysis was performed on the original  cardiac CTA dataset. Diagrammatic representation of the CT FFR analysis is provided in a separate PDF document in PACS. This dictation was created using the PDF document and an interactive 3D model of the results. The 3D model is not available in the EMR/PACS. INTERPRETATION: CT FFR provides simultaneous calculation of pressure and flow across the entire coronary tree. For clinical decision making, CT FFR values should be obtained 1-2 cm distal to the lower border of each stenosis measured. Coronary CTA-related artifacts may impair the diagnostic accuracy of the original cardiac CTA and FFR CT results. *Due to the fact that CT FFR represents a mathematically-derived analysis, it is recommended that the results be interpreted as follows: 1. CT FFR >0.80: Low likelihood of hemodynamic significance. 2. CT FFR 0.76-0.80: Borderline likelihood of hemodynamic significance. 3. CT FFR =< 0.75: High likelihood of hemodynamic significance. *Coronary CT Angiography-derived Fractional Flow Reserve Testing in Patients with Stable Coronary Artery Disease: Recommendations on Interpretation and Reporting. Radiology: Cardiothoracic Imaging. 2019;1(5):e190050 FINDINGS: 1. Left Main: 0.97; low likelihood of hemodynamic significance. 2. Prox LAD: 0.96; low likelihood of hemodynamic significance. 3. LCX: Too small for analysis. 4. Prox RCA: <0.50; high likelihood of hemodynamic significance. IMPRESSION: 1.  Proximal RCA is obstructive by CT FFR (<0.50). Lennie Odor, MD Electronically Signed   By: Lennie Odor M.D.   On: 04/14/2023 12:46   CT CORONARY MORPH W/CTA COR W/SCORE W/CA W/CM &/OR WO/CM  Result Date: 04/14/2023 CLINICAL DATA:  Chest pain EXAM: Cardiac/Coronary CTA TECHNIQUE: A non-contrast, gated CT scan was obtained with axial slices of 3 mm through the heart for calcium scoring. Calcium scoring was performed using the Agatston method. A 120 kV prospective, gated, contrast cardiac scan was obtained. Gantry rotation  speed was 250 msecs and collimation was 0.6 mm. Two sublingual nitroglycerin tablets (0.8 mg) were given. The 3D data set was reconstructed in 5% intervals of the 35-75% of the R-R cycle. Diastolic phases were analyzed on a dedicated workstation using MPR, MIP, and VRT modes. The patient received 95 cc of contrast. FINDINGS: Image quality: Excellent. Noise artifact is: Limited. Coronary Arteries:  Normal coronary origin.  Right dominance. Left main: The left main is a large caliber vessel with a normal take off from the left coronary cusp that bifurcates to form a left anterior descending artery and a left circumflex artery. There is mild non-calcified plaque (25-49%). Left anterior descending artery: The proximal LAD contains mild mixed density plaque (25-49%). The mid LAD is patent. The distal LAD is patent. D1 is patent. D2 contains minimal non-calcified plaque (<25%). D3 is patent. Left circumflex artery: The LCX is non-dominant. There is minimal non-calcified plaque (<25%). The LCX gives off 1 patent obtuse marginal branch. Right coronary artery: The RCA is dominant with normal take off from the right coronary cusp. The proximal RCA contains a severe mixed density plaque (70-99%). The mid RCA contains minimal mixed density plaque (<25%). The distal RCA contains  mild calcified plaque (25-49%). The RCA terminates as a PDA and right posterolateral branch. The PDA is patent. The PLB contains moderate mixed density plaque (50-69%). Right Atrium: Right atrial size is within normal limits. Right Ventricle: The right ventricular cavity is within normal limits. Left Atrium: Left atrial size is normal in size with no left atrial appendage filling defect. Left Ventricle: The ventricular cavity size is within normal limits. Pulmonary arteries: Normal in size. Pulmonary veins: Normal pulmonary venous drainage. Pericardium: Normal thickness without significant effusion or calcium present. Cardiac valves: The aortic valve is  trileaflet without significant calcification. The mitral valve is normal without significant calcification. Aorta: Normal caliber without significant disease. Extra-cardiac findings: See attached radiology report for non-cardiac structures. IMPRESSION: 1. Coronary calcium score of 424. This was 95th percentile for age-, sex, and race-matched controls. 2. Total plaque volume 351 mm3 which is 77th percentile for age- and sex-matched controls (calcified plaque 40 mm3; non-calcified plaque 311 mm3; low attenuation 2 mm3). TPV is severe. 3. Normal coronary origin with right dominance. 4. Severe mixed density stenosis in the proximal RCA (70-99%). 5. Moderate mixed density stenosis in the PLB (50-69%). 6. Mild non-calcified plaque (25-49%) in the left main. 7. Mild mixed density plaque in the proximal LAD (25-49%). RECOMMENDATIONS: 1. CAD-RADS 4: Severe stenosis in the proximal RCA. CT FFR will be submitted. Consider symptom-guided anti-ischemic pharmacotherapy as well as risk factor modification per guideline directed care. Invasive coronary angiography recommended with revascularization per published guideline statements. Lennie Odor, MD Electronically Signed   By: Lennie Odor M.D.   On: 04/14/2023 12:40    Disposition   Pt is being discharged home today in good condition.  Follow-up Plans & Appointments     Discharge Instructions     Amb Referral to Cardiac Rehabilitation   Complete by: As directed    Diagnosis: Coronary Stents   After initial evaluation and assessments completed: Virtual Based Care may be provided alone or in conjunction with Phase 2 Cardiac Rehab based on patient barriers.: Yes   Intensive Cardiac Rehabilitation (ICR) [facility] location only OR Traditional Cardiac Rehabilitation (TCR) *If criteria for ICR are not met will enroll in TCR [facility] only): Yes        Discharge Medications   Allergies as of 04/28/2023   No Known Allergies      Medication List     STOP taking these  medications    metoprolol tartrate 50 MG tablet Commonly known as: LOPRESSOR       TAKE these medications    albuterol (2.5 MG/3ML) 0.083% nebulizer solution Commonly known as: PROVENTIL Take 3 mLs (2.5 mg total) by nebulization every 6 (six) hours as needed for wheezing or shortness of breath.   Ventolin HFA 108 (90 Base) MCG/ACT inhaler Generic drug: albuterol TAKE 2 PUFFS BY MOUTH EVERY 6 HOURS AS NEEDED FOR WHEEZE OR SHORTNESS OF BREATH   apixaban 5 MG Tabs tablet Commonly known as: ELIQUIS Take 1 tablet (5 mg total) by mouth 2 (two) times daily.   aspirin EC 81 MG tablet Take 1 tablet (81 mg total) by mouth daily. Swallow whole.  STOP aspirin after 30 days, on 05/26/23 What changed: additional instructions   carvedilol 3.125 MG tablet Commonly known as: COREG Take 1 tablet (3.125 mg total) by mouth 2 (two) times daily with a meal.   clopidogrel 75 MG tablet Commonly known as: PLAVIX Take 1 tablet (75 mg total) by mouth daily with breakfast. Start taking on: April 29, 2023  Dulera 100-5 MCG/ACT Aero Generic drug: mometasone-formoterol INHALE 2 PUFFS INTO THE LUNGS IN THE MORNING AND AT BEDTIME.   losartan 25 MG tablet Commonly known as: COZAAR Take 1 tablet (25 mg total) by mouth daily.   nitroGLYCERIN 0.4 MG SL tablet Commonly known as: NITROSTAT Place 1 tablet (0.4 mg total) under the tongue every 5 (five) minutes as needed for chest pain.   pantoprazole 40 MG tablet Commonly known as: Protonix Take 1 tablet (40 mg total) by mouth daily.   rosuvastatin 20 MG tablet Commonly known as: CRESTOR Take 1 tablet (20 mg total) by mouth daily.   Spiriva Respimat 2.5 MCG/ACT Aers Generic drug: Tiotropium Bromide Monohydrate Inhale 1 puff into the lungs daily.   VITAMIN C PO Take 1 tablet by mouth daily.   VITAMIN D PO Take 1 tablet by mouth daily.   VITAMIN E PO Take 1 tablet by mouth daily.   ZINC PO Take 1 tablet by mouth daily.          Allergies No Known Allergies  Outstanding Labs/Studies   N/a  Duration of Discharge Encounter   Greater than 30 minutes including physician time.  Signed, Laverda Page, NP 04/28/2023, 1:06 PM

## 2023-04-29 ENCOUNTER — Encounter (HOSPITAL_COMMUNITY): Payer: Self-pay | Admitting: Cardiology

## 2023-04-30 ENCOUNTER — Ambulatory Visit (HOSPITAL_COMMUNITY): Payer: Medicaid Other | Attending: Cardiovascular Disease

## 2023-04-30 ENCOUNTER — Telehealth (HOSPITAL_COMMUNITY): Payer: Self-pay

## 2023-04-30 DIAGNOSIS — R079 Chest pain, unspecified: Secondary | ICD-10-CM | POA: Insufficient documentation

## 2023-04-30 LAB — ECHOCARDIOGRAM COMPLETE
Area-P 1/2: 2.87 cm2
S' Lateral: 2.7 cm

## 2023-04-30 NOTE — Telephone Encounter (Signed)
Called and spoke with pt in regards to CR, pt stated she is not interested at this time due to her work schedule and family coming in town.   Closed referral

## 2023-05-10 NOTE — Progress Notes (Unsigned)
  Cardiology Office Note:  .   Date:  05/10/2023  ID:  Pamela Mcclain, DOB [birth year], MRN [id number] PCP: Ivonne Andrew, NP  [facility] Providers Cardiologist:  Reatha Harps, MD { Click to update primary MD,subspecialty MD or APP then REFRESH:1}   History of Present Illness: Pamela Mcclain is a 62 y.o. female with history of CAD, systolic HF, Afib who presents for follow-up.      Problem List Takotsubo CM -EF 25-30%, apical ballooning  -EF 55-60% 04/30/2023 CAD -Severe proximal RCA 70-99% -> PCI 04/28/2023 -TPV 351 mm3 (calcified 40 mm3; non-calcified 311 mm3; low attenuation 2 mm3; 77th percentile; severe) -CAC 424 (95th percentile) 3. COPD 4. HLD -T chol 276, HDL 77, LDL 182, TG 101 5. Paroxysmal Afib -Dx 04/2023 at time of PCI     ROS: All other ROS reviewed and negative. Pertinent positives noted in the HPI.     Studies Reviewed: Marland Kitchen       Physical Exam:   VS:  There were no vitals taken for this visit.   Wt Readings from Last 3 Encounters:  04/28/23 112 lb (50.8 kg)  04/16/23 112 lb 12.8 oz (51.2 kg)  04/09/23 113 lb (51.3 kg)    GEN: Well nourished, well developed in no acute distress NECK: No JVD; No carotid bruits CARDIAC: ***RRR, no murmurs, rubs, gallops RESPIRATORY:  Clear to auscultation without rales, wheezing or rhonchi  ABDOMEN: Soft, non-tender, non-distended EXTREMITIES:  No edema; No deformity  ASSESSMENT AND PLAN: .   ***    {Are you ordering a CV Procedure (e.g. stress test, cath, DCCV, TEE, etc)?   Press F2        :[id number]}   Follow-up: No follow-ups on file.  Time Spent with Patient: I have spent a total of *** minutes caring for this patient today face to face, ordering and reviewing labs/tests, reviewing prior records/medical history, examining the patient, establishing an assessment and plan, communicating results/findings to the patient/family, and documenting in the medical record.   Signed, Lenna Gilford. Flora Lipps, MD,  [facility]  [facility]  [address] [place], [place] [address] [phone]  5:17 PM

## 2023-05-11 ENCOUNTER — Ambulatory Visit: Payer: Medicaid Other | Admitting: Cardiovascular Disease

## 2023-05-13 ENCOUNTER — Encounter: Payer: Self-pay | Admitting: Surgery

## 2023-05-13 ENCOUNTER — Ambulatory Visit (INDEPENDENT_AMBULATORY_CARE_PROVIDER_SITE_OTHER): Payer: Medicaid Other | Admitting: Surgery

## 2023-05-13 VITALS — BP 121/80 | HR 101 | Temp 98.3°F | Ht 61.0 in | Wt 110.0 lb

## 2023-05-13 DIAGNOSIS — K429 Umbilical hernia without obstruction or gangrene: Secondary | ICD-10-CM

## 2023-05-13 NOTE — Patient Instructions (Addendum)
Umbilical Hernia, Adult  A hernia is a lump of tissue that pushes through an opening in the muscles. An umbilical hernia happens in the belly, near the belly button. The hernia may contain tissues from the small or large intestine. It may also have fatty tissue that covers the intestines. Umbilical hernias in adults may get worse over time. They need to be treated with surgery. There are several types of umbilical hernias. They include: Indirect hernia. This occurs just above or below the belly button. It's the most common type of umbilical hernia in adults. Direct hernia. This type occurs in an opening that's formed by the belly button. Reducible hernia. This hernia comes and goes. You may see it only when you strain, cough, or lift something heavy. This type of hernia can be pushed back into the belly (reduced). Incarcerated hernia. This traps the hernia in the wall of the belly. This type of hernia can't be pushed back into the belly. It can cause a strangulated hernia. Strangulated hernia. This hernia cuts off blood flow to the tissues inside the hernia. The tissues can die if this happens. This type of hernia must be treated right away. What are the causes? An umbilical hernia happens when tissue inside the belly pushes through an opening in the muscles of the belly. What increases the risk? You're more likely to get this hernia if: You strain while lifting or pushing heavy objects. You've had several pregnancies. You have a condition that puts pressure on your belly, and you've had it for a long time. These include: Obesity. A buildup of fluid inside your belly. Vomiting or coughing all the time. Trouble pooping (constipation). You've had surgery that weakened the muscles in the belly. What are the signs or symptoms? The main symptom of this condition is a bulge at the belly button or near it. The bulge does not cause pain. Other symptoms depend on the type of hernia you have. A  reducible hernia may be seen only when you strain, cough, or lift something heavy. Other symptoms may include: Dull pain. A feeling of pressure. An incarcerated hernia may cause very bad pain. Also, you may: Vomit or feel like you may vomit. Not be able to pass gas. A strangulated hernia may cause: Pain that gets worse and worse. Vomiting, or feeling like you may vomit. Pain when you press on the hernia. Change of color on the skin over the hernia. The skin may become red or purple. Trouble pooping. Blood in the poop. How is this diagnosed? This condition may be diagnosed based on: Your symptoms and medical history. A physical exam. You may be asked to cough or strain while standing. These actions will put pressure inside your belly. The pressure can force the hernia through the opening in your muscles. Your health care provider may try to push the hernia back into your belly (reduce). How is this treated? Surgery is the only treatment for an umbilical hernia. Surgery for a strangulated hernia must be done right away. If you have a small hernia that's not incarcerated, you may need to lose weight before the surgery is done. Follow these instructions at home: Managing constipation You may need to take these actions to prevent trouble pooping. This will help to prevent straining. Drink enough fluid to keep your pee (urine) pale yellow. Take over-the-counter or prescription medicines. Eat foods that are high in fiber, such as beans, whole grains, and fresh fruits and vegetables. Limit foods that are high in  fat and sugars, such as fried or sweet foods. General instructions Do not try to push the hernia back in. Lose weight, if told by your provider. Watch your hernia for any changes in color or size. Tell your provider if any changes occur. You may need to avoid activities that put pressure on your hernia. You may have to avoid lifting. Ask your provider how much you can safely  lift. Take over-the-counter and prescription medicines only as told by your provider. Contact a health care provider if: Your hernia gets larger or feels hard. Your hernia becomes painful. You get a fever or chills. Get help right away if: You get very bad pain near the area of the hernia, and the pain comes on suddenly. You have pain and you vomit or feel like you may vomit. The skin over your hernia changes color. These symptoms may be an emergency. Get help right away. Call 911. Do not wait to see if the symptoms go away. Do not drive yourself to the hospital. This information is not intended to replace advice given to you by your health care provider. Make sure you discuss any questions you have with your health care provider. Document Revised: 09/02/2022 Document Reviewed: 09/02/2022 Elsevier Patient Education  2024 [employer].

## 2023-05-13 NOTE — Progress Notes (Signed)
05/13/2023  Reason for Visit: Umbilical hernia  Requesting Provider: Angus Seller, NP  History of Present Illness: Pamela Mcclain is a 62 y.o. female presenting for evaluation of an umbilical hernia.  The patient reports that she has had this for about 8 months.  She reported having COVID back then he was coughing a lot and she felt this started after coughing.  There was 1 episode where she felt a popping sensation while she was coughing very hard.  She does not think that hernias been growing in size and reports it is small overall.  However it does cause tenderness particular when there is anything bulging.  She is able to reduce the hernia herself and has not had episodes where the hernia is incarcerated.  Denies any nausea, vomiting, constipation, diarrhea.  The main symptom she has is some discomfort in the umbilical area with some tightness or pulling sensation in bilateral lower areas.  Of note, the patient has been recently also having chest pain and had a left heart catheterization on 04/28/2023 which resulted in DES stent placement in the RCA.  She is currently on Eliquis for atrial fibrillation that started after her procedure and also on dual antiplatelet with aspirin and Plavix.  She has been informed that the Plavix will have to be on board for at least 6 months.  After her procedure, she has been feeling much better and denies any further chest pain or shortness of breath issues.  Past Medical History: Past Medical History:  Diagnosis Date   CHF (congestive heart failure) (HCC)    COPD (chronic obstructive pulmonary disease) (HCC)    Coronary artery disease    Hyperlipidemia    Hypertension      Past Surgical History: Past Surgical History:  Procedure Laterality Date   CARDIAC CATHETERIZATION     CORONARY STENT INTERVENTION N/A 04/28/2023   Procedure: CORONARY STENT INTERVENTION;  Surgeon: Marykay Lex, MD;  Location: [facility] INVASIVE CV LAB;  Service: Cardiovascular;   Laterality: N/A;   LEFT HEART CATH AND CORONARY ANGIOGRAPHY N/A 07/08/2021   Procedure: LEFT HEART CATH AND CORONARY ANGIOGRAPHY;  Surgeon: Rinaldo Cloud, MD;  Location: [facility] INVASIVE CV LAB;  Service: Cardiovascular;  Laterality: N/A;   LEFT HEART CATH AND CORONARY ANGIOGRAPHY N/A 04/28/2023   Procedure: LEFT HEART CATH AND CORONARY ANGIOGRAPHY;  Surgeon: Marykay Lex, MD;  Location: [facility] INVASIVE CV LAB;  Service: Cardiovascular;  Laterality: N/A;    Home Medications: Prior to Admission medications   Medication Sig Start Date End Date Taking? Authorizing Provider  albuterol (PROVENTIL) (2.5 MG/3ML) 0.083% nebulizer solution Take 3 mLs (2.5 mg total) by nebulization every 6 (six) hours as needed for wheezing or shortness of breath. 11/28/22  Yes Cobb, Ruby Cola, NP  apixaban (ELIQUIS) 5 MG TABS tablet Take 1 tablet (5 mg total) by mouth 2 (two) times daily. 04/28/23  Yes Laverda Page B, NP  Ascorbic Acid (VITAMIN C PO) Take 1 tablet by mouth daily.   Yes [provider]  aspirin EC 81 MG tablet Take 1 tablet (81 mg total) by mouth daily. Swallow whole.  STOP aspirin after 30 days, on 05/26/23 04/28/23  Yes Laverda Page B, NP  carvedilol (COREG) 3.125 MG tablet Take 1 tablet (3.125 mg total) by mouth 2 (two) times daily with a meal. 01/08/23  Yes Ivonne Andrew, NP  DULERA 100-5 MCG/ACT AERO INHALE 2 PUFFS INTO THE LUNGS IN THE MORNING AND AT BEDTIME. 04/09/23  Yes Allison Quarry, Ruby Cola, NP  losartan (COZAAR) 25 MG tablet Take 1 tablet (25 mg total) by mouth daily. 03/30/23 06/28/23 Yes O'Neal, Ronnald Ramp, MD  Multiple Vitamins-Minerals (ZINC PO) Take 1 tablet by mouth daily.   Yes [provider]  nitroGLYCERIN (NITROSTAT) 0.4 MG SL tablet Place 1 tablet (0.4 mg total) under the tongue every 5 (five) minutes as needed for chest pain. 04/16/23 07/15/23 Yes O'Neal, Ronnald Ramp, MD  pantoprazole (PROTONIX) 40 MG tablet Take 1 tablet (40 mg total) by mouth daily. 04/28/23 06/27/23  Yes Laverda Page B, NP  rosuvastatin (CRESTOR) 20 MG tablet Take 1 tablet (20 mg total) by mouth daily. 04/17/23 07/16/23 Yes O'Neal, Ronnald Ramp, MD  SPIRIVA RESPIMAT 2.5 MCG/ACT AERS Inhale 1 puff into the lungs daily. 02/06/23  Yes [provider]  VENTOLIN HFA 108 (90 Base) MCG/ACT inhaler TAKE 2 PUFFS BY MOUTH EVERY 6 HOURS AS NEEDED FOR WHEEZE OR SHORTNESS OF BREATH 03/26/23  Yes Cobb, Ruby Cola, NP  VITAMIN D PO Take 1 tablet by mouth daily.   Yes [provider]    Allergies: No Known Allergies  Social History:  reports that she quit smoking about 6 years ago. Her smoking use included cigarettes. She started smoking about 44 years ago. She has a 76 pack-year smoking history. She has never used smokeless tobacco. She reports that she does not currently use alcohol. She reports that she does not use drugs.   Family History: Family History  Problem Relation Age of Onset   Heart disease Mother     Review of Systems: Review of Systems  Constitutional:  Negative for chills and fever.  Respiratory:  Negative for shortness of breath.   Cardiovascular:  Negative for chest pain.  Gastrointestinal:  Positive for abdominal pain. Negative for nausea and vomiting.  Genitourinary:  Negative for dysuria.    Physical Exam BP 121/80   Pulse (!) 101   Temp 98.3 F (36.8 C) (Oral)   Ht 5\' 1"  (1.549 m)   Wt 110 lb (49.9 kg)   SpO2 95%   BMI 20.78 kg/m  CONSTITUTIONAL: No acute distress, well-nourished HEENT:  Normocephalic, atraumatic, extraocular motion intact. RESPIRATORY:  Lungs are clear, and breath sounds are equal bilaterally. Normal respiratory effort without pathologic use of accessory muscles. CARDIOVASCULAR: Irregular rhythm, regular rate. GI: The abdomen is soft, nondistended, with mild discomfort to palpation at the umbilicus itself.  Patient has a reducible umbilical hernia which appears to be small, likely 1 cm or less.  No overlying skin changes to  suggest strangulation.  MUSCULOSKELETAL:  Normal muscle strength and tone in all four extremities.  No peripheral edema or cyanosis. NEUROLOGIC:  Motor and sensation is grossly normal.  Cranial nerves are grossly intact. PSYCH:  Alert and oriented to person, place and time. Affect is normal.  Laboratory Analysis: Labs from 04/16/2023: Sodium 142, potassium 4.4, chloride 105, CO2 22, BUN 13, creatinine 0.86.  WBC 5.1, hemoglobin 12.8, hematocrit 39.5, platelets 239.  Imaging: No results found.  Assessment and Plan: This is a 62 y.o. female with a reducible umbilical hernia.  - Discussed with patient the findings on exam with support and umbilical hernia which is reducible.  The patient does have symptoms from the hernia but they are mild and only happen when the hernia is bulging.  She is able to reduce the hernia herself.  Discussed with patient that typical management would be a hernia repair but given that she just recently had a drug-eluting stent placed and is on dual  antiplatelet and Eliquis for atrial fibrillation, at this point we will be unable to proceed with any elective surgery.  The patient understands this and she already knew that she will have to be on Plavix for at least 6 months.  Discussed with the patient that given these medications, she would be at too high risk of bleeding but also an additional stress to her heart which is still recovering from this procedure.  As such, we will hold off on any elective procedures at this point. - Patient will follow-up with me in 6 months to reevaluate her progress.  She is aware that if the umbilical hernia starts becoming more symptomatic, she should let us know right away as we may have to proceed with surgery if it is becoming more urgent or emergent.  I spent 30 minutes dedicated to the care of this patient on the date of this encounter to include pre-visit review of records, face-to-face time with the patient discussing diagnosis and  management, and any post-visit coordination of care.   Howie Ill, MD [facility]

## 2023-05-18 ENCOUNTER — Other Ambulatory Visit (HOSPITAL_COMMUNITY): Payer: Self-pay

## 2023-05-19 ENCOUNTER — Other Ambulatory Visit (HOSPITAL_COMMUNITY): Payer: Self-pay

## 2023-05-21 ENCOUNTER — Other Ambulatory Visit (HOSPITAL_COMMUNITY): Payer: Self-pay

## 2023-05-25 ENCOUNTER — Ambulatory Visit (HOSPITAL_BASED_OUTPATIENT_CLINIC_OR_DEPARTMENT_OTHER): Payer: Medicaid Other | Admitting: Pulmonary Disease

## 2023-05-25 DIAGNOSIS — J432 Centrilobular emphysema: Secondary | ICD-10-CM | POA: Diagnosis not present

## 2023-05-25 LAB — PULMONARY FUNCTION TEST
DL/VA % pred: 38 %
DL/VA: 1.65 ml/min/mmHg/L
DLCO cor % pred: 43 %
DLCO cor: 7.38 ml/min/mmHg
DLCO unc % pred: 42 %
DLCO unc: 7.24 ml/min/mmHg
FEF 25-75 Post: 0.4 L/s
FEF 25-75 Pre: 0.37 L/s
FEF2575-%Change-Post: 9 %
FEF2575-%Pred-Post: 20 %
FEF2575-%Pred-Pre: 18 %
FEV1-%Change-Post: 7 %
FEV1-%Pred-Post: 41 %
FEV1-%Pred-Pre: 38 %
FEV1-Post: 0.86 L
FEV1-Pre: 0.8 L
FEV1FVC-%Change-Post: 2 %
FEV1FVC-%Pred-Pre: 50 %
FEV6-%Change-Post: 5 %
FEV6-%Pred-Post: 79 %
FEV6-%Pred-Pre: 75 %
FEV6-Post: 2.06 L
FEV6-Pre: 1.95 L
FEV6FVC-%Change-Post: 1 %
FEV6FVC-%Pred-Post: 100 %
FEV6FVC-%Pred-Pre: 99 %
FVC-%Change-Post: 4 %
FVC-%Pred-Post: 79 %
FVC-%Pred-Pre: 76 %
FVC-Post: 2.14 L
FVC-Pre: 2.05 L
Post FEV1/FVC ratio: 40 %
Post FEV6/FVC ratio: 96 %
Pre FEV1/FVC ratio: 39 %
Pre FEV6/FVC Ratio: 95 %
RV % pred: 189 %
RV: 3.51 L
TLC % pred: 136 %
TLC: 6.04 L

## 2023-05-25 NOTE — Progress Notes (Signed)
Full PFT Performed Today  

## 2023-05-25 NOTE — Patient Instructions (Signed)
Full PFT Performed Today  

## 2023-05-29 ENCOUNTER — Telehealth (INDEPENDENT_AMBULATORY_CARE_PROVIDER_SITE_OTHER): Payer: PPO | Admitting: Nurse Practitioner

## 2023-05-29 ENCOUNTER — Encounter: Payer: Self-pay | Admitting: Nurse Practitioner

## 2023-05-29 DIAGNOSIS — I4891 Unspecified atrial fibrillation: Secondary | ICD-10-CM | POA: Diagnosis not present

## 2023-05-29 DIAGNOSIS — I25110 Atherosclerotic heart disease of native coronary artery with unstable angina pectoris: Secondary | ICD-10-CM | POA: Diagnosis not present

## 2023-05-29 DIAGNOSIS — J449 Chronic obstructive pulmonary disease, unspecified: Secondary | ICD-10-CM

## 2023-05-29 DIAGNOSIS — Z87891 Personal history of nicotine dependence: Secondary | ICD-10-CM | POA: Diagnosis not present

## 2023-05-29 MED ORDER — BREZTRI AEROSPHERE 160-9-4.8 MCG/ACT IN AERO
2.0000 | INHALATION_SPRAY | Freq: Two times a day (BID) | RESPIRATORY_TRACT | 11 refills | Status: DC
  Filled 2023-06-02: qty 10.7, 30d supply, fill #0
  Filled 2023-06-20 – 2023-06-25 (×3): qty 10.7, 30d supply, fill #1
  Filled 2023-07-26: qty 10.7, 30d supply, fill #2

## 2023-05-29 NOTE — Assessment & Plan Note (Signed)
 S/p stenting with clinical improvement. Recent echo with normalized EF and GIDD. Follow up with cardiology as scheduled

## 2023-05-29 NOTE — Assessment & Plan Note (Signed)
 76 pack year hx. Referred to lung cancer screening program previously - due November 2024. Will f/u with them regarding scheduled for her LDCT chest. Recent cardiac CT without any suspicious nodules/masses in visualized lung fields. Counseled to remain smoke free

## 2023-05-29 NOTE — Assessment & Plan Note (Signed)
 Compliant with anticoagulation. Follow up with cardiology

## 2023-05-29 NOTE — Assessment & Plan Note (Signed)
 Severe COPD with FEV1 38% and severe diffusion defect.  She has had better response with Breztri  in the past.  Will update her prescription to Breztri  2 puffs twice daily.  Side effect profile reviewed.  Patient is aware of proper inhaler use.  Will let us  know if she has any difficulties with affordability.  Encouraged her to work on graded exercises.  Action plan in place.  Will obtain ONO on room air to rule out nocturnal hypoxemia given her diffusing defect and severe lung disease.  Patient Instructions  Continue Albuterol  inhaler 2 puffs or 3 mL neb every 6 hours as needed for shortness of breath or wheezing. Notify if symptoms persist despite rescue inhaler/neb use. Use neb in the morning to help open up your chest if needed  Stop Dulera  and Spiriva . Start Breztri  2 puffs Twice daily. Brush tongue and rinse mouth afterwards Continue daily allergy pill over the counter such as claritin, zyrtec, xyzal or allegra    Will follow up with the lung cancer screening program - call me if you haven't heard something in the next few weeks   Glad you are feeling better!  Overnight oxygen study on room air    Follow up with Dr. Annella or Katie Elynore Dolinski,NP in 4 months. If symptoms do not improve or worsen, please contact office for sooner follow up or seek emergency care.

## 2023-05-29 NOTE — Patient Instructions (Signed)
 Continue Albuterol  inhaler 2 puffs or 3 mL neb every 6 hours as needed for shortness of breath or wheezing. Notify if symptoms persist despite rescue inhaler/neb use. Use neb in the morning to help open up your chest if needed  Stop Dulera  and Spiriva . Start Breztri  2 puffs Twice daily. Brush tongue and rinse mouth afterwards Continue daily allergy pill over the counter such as claritin, zyrtec, xyzal or allegra    Will follow up with the lung cancer screening program - call me if you haven't heard something in the next few weeks   Glad you are feeling better!  Overnight oxygen study on room air    Follow up with Dr. Annella or Katie Omri Bertran,NP in 4 months. If symptoms do not improve or worsen, please contact office for sooner follow up or seek emergency care.

## 2023-05-29 NOTE — Progress Notes (Signed)
 Patient ID: Pamela Mcclain, female     DOB: May 31, 1960, 63 y.o.      MRN: [id number]  Chief Complaint  Patient presents with   Follow-up    Dulera  is not working as well. Requesting to re-start Breztri . Shortness of breath on exertion. Occasional cough and wheezing mostly at night.     Virtual Visit via Video Note  I connected with Pamela Mcclain on 05/29/23 at  2:30 PM EST by a video enabled telemedicine application and verified that I am speaking with the correct person using two identifiers.  Location: Patient: Home Provider: Office   I discussed the limitations of evaluation and management by telemedicine and the availability of in person appointments. The patient expressed understanding and agreed to proceed.  History of Present Illness: 63 year old female, former smoker followed for severe COPD. She is a patient of Dr. Leatha and last seen in office 02/23/2023 by [facility] Mcclain. Past medical history significant for NSTEMI, HTN, depression.    TEST/EVENTS:  07/07/2021 echo: EF 25-30%. LV dilated. RV size and function nl. Nl PASP. Trivial MR. 03/26/2022 CTA chest: atherosclerosis. No evidence of PE. Emphysema. Small linear infiltrates at costophrenic angles. Minimal b/l pleural effusions. Calcified granuloma.  04/30/2023 echo: EF 55 to 60%.  G1 DD.  RV size and function normal.  Normal PASP.  Trivial MR. 05/25/2023 PFT: FEV1 76, FEV1 38, ratio 40, TLC 136, DLCO 43.  Severe obstructive lung disease without reversibility, hyperinflated lung volumes and severe diffusion defect   04/07/2022: OV with Dr. Annella. Contacted 2-3 weeks ago d/t left arm pain radiating to neck and chest. CTA PE protocol was negative. She was asked to go to the ED for cardiac evaluation but declined. Continues to have left shoulder pain. Seems more MSK in nature; worse with different positions. Signs of rotator cuff inflammation/pain on exam. Referral to PT and orthopedics if symptoms do not improve; declined at  this time. Breathig is stable on symbicort . Better on Breztri  but too expensive. Consider trelegy if patient assistance for Breztri  denied.    11/28/2022: OV with Pamela Mcclain for follow up with her husband. She has been feeling about the same as her last visit. She did finally get insurance and wants to be able to go back to Breztri . She tried this in the past and she felt like it helped her more than the Symbicort  does. She gets short winded with longer distances and uphill climbing. Able to complete ADLs without difficulty. She has a lot of cough in the morning. Once she gets the phlegm up, she feels better. Thick, clear to white mucus. Notices an occasional wheeze. She denies any fevers, chills, night sweats, hemoptysis, leg swelling, orthopnea, PND, palpitations, CP, anorexia, weight loss. She has not seen cardiology recently. She's also off her blood pressure medication. She's been monitoring them at home. She is planning to get back in with a new primary care provider and cardiologist now that she has new insurance.    01/12/2023: Ov with Pamela Mcclain for follow up. Breztri  ended up not being covered by her insurance. She's on Dulera  now, which she feels like helps but still not quite as good as her Breztri . She feels like her breathing is at her baseline. Able to complete ADLs without difficulties. Cough is primarily in the AM. Usually has some thick mucus that is white in color. Hasn't noticed much wheezing. She denies fevers, chills, hemoptysis, leg swelling, orthopnea, chest congestion, anorexia, weight loss. She is having some  trouble with her blood pressure medicines. She feels like when she takes them, they make her BP too low and then she feels weak/fatigued but if she doesn't take them, her BP stays around 140 systolic. She has an appointment with her heart doctor in November. Denies chest pain, palpitations, lightheadedness/dizziness, syncope.    02/23/2023: OV with Pamela Mcclain for follow up. She was started on  Trelegy at our last visit. Unfortunately, she felt like this gave her joint pains so she stopped it and went back to her Dulera . She has been doing okay. Feeling about the same for the most part. She does feel like her eyes have been a little more itchy and watery recently. Thinks she may be coughing just a little bit more. Breathing is stable. No wheezing or chest congestion. Hasn't had to use albuterol  too much.   05/29/2023: Today - follow up Patient presents today for follow-up via virtual visit.  Last year, she had to change from Breztri  due to insurance.  She at 1 point tried Trelegy but she had joint pains so she went back to Dulera  and we added Spiriva .  She still feels like the Breztri  works better for her.  She felt her activity tolerance was improved with it and chest was more open.  She did change her insurance plan and was told that Breztri  would be covered.  She would like to switch back to this.  Otherwise, she has been doing relatively well.  She had a stent placed for mid RCA stenosis.  Since then, she actually feels like her breathing has improved and she has been able to walk more.  Feels like she has recovered well since the procedure.  She did have a new diagnosis of A-fib noted during the procedure and confirmed on post cath EKG.  She was prescribed Eliquis  for anticoagulation.  She did not require antiarrhythmic.  Has not had any issues with palpitations, chest discomfort or lightheadedness/dizziness since.  No issues with increased cough, chest congestion.  She did have a little more wheezing last week but this has resolved. Never heard from lung cancer screening program.  She was due in November 2024.  She did have a recent cardiac CT and visualized lung fields did not show any suspicious nodules or masses. She had pulmonary function testing on 05/25/2023 that revealed severe obstructive lung disease, hyperinflated lung volumes and severe diffusing defect.  She has had previous walk test  without desaturations in office.  Has never had a overnight oxygen study.  Does not notice any low oxygen levels at home.  No Known Allergies  There is no immunization history on file for this patient. Past Medical History:  Diagnosis Date   CHF (congestive heart failure) (HCC)    COPD (chronic obstructive pulmonary disease) (HCC)    Coronary artery disease    Hyperlipidemia    Hypertension     Tobacco History: Social History   Tobacco Use  Smoking Status Former   Current packs/day: 0.00   Average packs/day: 2.0 packs/day for 38.0 years (76.0 ttl pk-yrs)   Types: Cigarettes   Start date: 05/26/1978   Quit date: 05/26/2016   Years since quitting: 7.0  Smokeless Tobacco Never   Counseling given: Not Answered   Outpatient Medications Prior to Visit  Medication Sig Dispense Refill   albuterol  (PROVENTIL ) (2.5 MG/3ML) 0.083% nebulizer solution Take 3 mLs (2.5 mg total) by nebulization every 6 (six) hours as needed for wheezing or shortness of breath. 75 mL 6  apixaban  (ELIQUIS ) 5 MG TABS tablet Take 1 tablet (5 mg total) by mouth 2 (two) times daily. 60 tablet 11   Ascorbic Acid (VITAMIN C PO) Take 1 tablet by mouth daily.     aspirin  EC 81 MG tablet Take 1 tablet (81 mg total) by mouth daily. Swallow whole.  STOP aspirin  after 30 days, on 05/26/23 30 tablet 0   carvedilol  (COREG ) 3.125 MG tablet Take 1 tablet (3.125 mg total) by mouth 2 (two) times daily with a meal. 180 tablet 3   losartan  (COZAAR ) 25 MG tablet Take 1 tablet (25 mg total) by mouth daily. 90 tablet 3   Multiple Vitamins-Minerals (ZINC PO) Take 1 tablet by mouth daily.     nitroGLYCERIN  (NITROSTAT ) 0.4 MG SL tablet Place 1 tablet (0.4 mg total) under the tongue every 5 (five) minutes as needed for chest pain. 90 tablet 3   pantoprazole  (PROTONIX ) 40 MG tablet Take 1 tablet (40 mg total) by mouth daily. 30 tablet 1   rosuvastatin  (CRESTOR ) 20 MG tablet Take 1 tablet (20 mg total) by mouth daily. 90 tablet 3    VENTOLIN  HFA 108 (90 Base) MCG/ACT inhaler TAKE 2 PUFFS BY MOUTH EVERY 6 HOURS AS NEEDED FOR WHEEZE OR SHORTNESS OF BREATH 18 each 3   DULERA  100-5 MCG/ACT AERO INHALE 2 PUFFS INTO THE LUNGS IN THE MORNING AND AT BEDTIME. 8.8 each 5   SPIRIVA  RESPIMAT 2.5 MCG/ACT AERS Inhale 1 puff into the lungs daily.     VITAMIN D PO Take 1 tablet by mouth daily.     No facility-administered medications prior to visit.     Review of Systems:   Constitutional: No weight loss or gain, night sweats, fevers, chills, or lassitude. +fatigue (improved) HEENT: No headaches, difficulty swallowing, tooth/dental problems, or sore throat. No sneezing, itching, ear ache, nasal congestion, or post nasal drip CV:  No chest pain, orthopnea, PND, swelling in lower extremities, anasarca, dizziness, palpitations, syncope Resp: +improved shortness of breath with exertion; AM cough. No excess mucus or change in color of mucus. No hemoptysis. No wheezing.  No chest wall deformity GI:  No heartburn, indigestion, abdominal pain, nausea, vomiting, diarrhea, change in bowel habits, loss of appetite, bloody stools.  GU: No dysuria, change in color of urine, urgency or frequency.  No flank pain, no hematuria  Skin: No rash, lesions, ulcerations MSK:  No joint pain or swelling.   Neuro: No dizziness or lightheadedness.  Psych: No depression or anxiety. Mood stable.   Observations/Objective: Patient is well-developed, well-nourished in no acute distress. A&Ox3. Resting comfortably at home. She was walking around the house in the beginning of the visit without any dyspnea. Able to speak in complete sentences with exertion. No labored breathing. Speech is clear and coherent with logical content.    Assessment and Plan: COPD, severe (HCC) Severe COPD with FEV1 38% and severe diffusion defect.  She has had better response with Breztri  in the past.  Will update her prescription to Breztri  2 puffs twice daily.  Side effect profile  reviewed.  Patient is aware of proper inhaler use.  Will let us  know if she has any difficulties with affordability.  Encouraged her to work on graded exercises.  Action plan in place.  Will obtain ONO on room air to rule out nocturnal hypoxemia given her diffusing defect and severe lung disease.  Patient Instructions  Continue Albuterol  inhaler 2 puffs or 3 mL neb every 6 hours as needed for shortness of breath or  wheezing. Notify if symptoms persist despite rescue inhaler/neb use. Use neb in the morning to help open up your chest if needed  Stop Dulera  and Spiriva . Start Breztri  2 puffs Twice daily. Brush tongue and rinse mouth afterwards Continue daily allergy pill over the counter such as claritin, zyrtec, xyzal or allegra    Will follow up with the lung cancer screening program - call me if you haven't heard something in the next few weeks   Glad you are feeling better!  Overnight oxygen study on room air    Follow up with Dr. Annella or Katie Merton Wadlow,Mcclain in 4 months. If symptoms do not improve or worsen, please contact office for sooner follow up or seek emergency care.   Coronary artery disease involving native coronary artery of native heart with unstable angina pectoris (HCC) S/p stenting with clinical improvement. Recent echo with normalized EF and GIDD. Follow up with cardiology as scheduled  New onset atrial fibrillation (HCC) Compliant with anticoagulation. Follow up with cardiology  Former smoker 76 pack year hx. Referred to lung cancer screening program previously - due November 2024. Will f/u with them regarding scheduled for her LDCT chest. Recent cardiac CT without any suspicious nodules/masses in visualized lung fields. Counseled to remain smoke free    I discussed the assessment and treatment plan with the patient. The patient was provided an opportunity to ask questions and all were answered. The patient agreed with the plan and demonstrated an understanding of the  instructions.   The patient was advised to call back or seek an in-person evaluation if the symptoms worsen or if the condition fails to improve as anticipated.  I provided 31 minutes of non-face-to-face time during this encounter.   Pamela LULLA Rouleau, Mcclain

## 2023-06-01 NOTE — Telephone Encounter (Signed)
 Prior Auth team please advise.

## 2023-06-02 ENCOUNTER — Other Ambulatory Visit (HOSPITAL_COMMUNITY): Payer: Self-pay

## 2023-06-02 MED ORDER — ALBUTEROL SULFATE (2.5 MG/3ML) 0.083% IN NEBU: 3.0000 mL | INHALATION_SOLUTION | Freq: Four times a day (QID) | RESPIRATORY_TRACT | 6 refills | Status: DC | PRN

## 2023-06-02 MED ORDER — CARVEDILOL 3.125 MG PO TABS: 3.1250 mg | ORAL_TABLET | Freq: Two times a day (BID) | ORAL | 3 refills | Status: DC

## 2023-06-02 MED ORDER — BREZTRI AEROSPHERE 160-9-4.8 MCG/ACT IN AERO: 2.0000 | INHALATION_SPRAY | Freq: Two times a day (BID) | RESPIRATORY_TRACT | 11 refills | Status: DC

## 2023-06-02 MED ORDER — DULERA 100-5 MCG/ACT IN AERO: 2.0000 | INHALATION_SPRAY | Freq: Two times a day (BID) | RESPIRATORY_TRACT | 5 refills | Status: DC

## 2023-06-02 MED ORDER — LOSARTAN POTASSIUM 25 MG PO TABS: 25.0000 mg | ORAL_TABLET | Freq: Every day | ORAL | 3 refills | Status: DC

## 2023-06-02 MED ORDER — ALBUTEROL SULFATE HFA 108 (90 BASE) MCG/ACT IN AERS: 2.0000 | INHALATION_SPRAY | Freq: Four times a day (QID) | RESPIRATORY_TRACT | 3 refills | Status: DC | PRN

## 2023-06-02 MED ORDER — ROSUVASTATIN CALCIUM 20 MG PO TABS: 20.0000 mg | ORAL_TABLET | Freq: Every day | ORAL | 3 refills | Status: DC

## 2023-06-02 MED ORDER — NITROGLYCERIN 0.4 MG SL SUBL: 0.4000 mg | SUBLINGUAL_TABLET | SUBLINGUAL | 3 refills | Status: DC | PRN

## 2023-06-02 MED ORDER — ROSUVASTATIN CALCIUM 5 MG PO TABS: 5.0000 mg | ORAL_TABLET | Freq: Every day | ORAL | 11 refills | Status: DC

## 2023-06-02 MED ORDER — SPIRIVA RESPIMAT 2.5 MCG/ACT IN AERS: 2.0000 | INHALATION_SPRAY | Freq: Every day | RESPIRATORY_TRACT | 5 refills | Status: DC

## 2023-06-08 ENCOUNTER — Encounter: Payer: Self-pay | Admitting: Nurse Practitioner

## 2023-06-09 ENCOUNTER — Telehealth: Payer: Self-pay | Admitting: Nurse Practitioner

## 2023-06-11 NOTE — Progress Notes (Signed)
Cardiology Office Note:  .   Date:  06/12/2023  ID:  Valerieann Pulsifer, DOB 10/01/1960, MRN [id number] PCP: Ivonne Andrew, NP  [facility] Providers Cardiologist:  Reatha Harps, MD { History of Present Illness: Miss Dugal is a 63 y.o. female with history of CAD, CHF, HLD who presents for follow-up.    History of Present Illness   [name], a 63 year old with a history of coronary artery disease (CAD), presents for a follow-up visit after a stent placement in the right coronary artery. The patient reports that the procedure went well and the site of the procedure on the arm has healed. The patient denies experiencing any chest pain post-procedure but reports an ache when exerting a lot. The patient is unsure if this ache is related to the heart or the lungs. The patient also had a brief episode of atrial fibrillation (AFib) post-procedure but reports no recurrence of AFib since then. The patient's blood pressure is noted to be low and she experiences occasional dizziness, which she is unsure if it is related to standing up too quickly or something else. The patient is making lifestyle changes including increased exercise and dietary modifications to improve her health. Back on crestor. Needs repeat lipids and TSH.          Problem List Takotsubo CM -EF 25-30%, apical ballooning 07/08/2021 -EF 55-60% 04/30/2023 CAD -PCI to pRCA 04/28/2023 -TPV 351 mm3 (calcified 40 mm3; non-calcified 311 mm3; low attenuation 2 mm3; 77th percentile; severe) -CAC 424 (95th percentile) 3. COPD 4. HLD -T chol 276, HDL 77, LDL 182, TG 101 -Lpa 16.1  5. Paroxysmal Afib  -CHADSVASC=3 (age, CAD, female)     ROS: All other ROS reviewed and negative. Pertinent positives noted in the HPI.     Studies Reviewed: Marland Kitchen   EKG Interpretation Date/Time:  Friday June 12 2023 13:57:52 EST Ventricular Rate:  73 PR Interval:  120 QRS Duration:  70 QT Interval:  378 QTC Calculation: 416 R  Axis:   75  Text Interpretation: Normal sinus rhythm Septal infarct , age undetermined Confirmed by Lennie Odor [phone]) on 06/12/2023 2:25:22 PM   Physical Exam:   VS:  BP 100/60 (BP Location: Left Arm, Patient Position: Sitting, Cuff Size: Normal)   Pulse 73   Ht 5\' 1"  (1.549 m)   Wt 110 lb (49.9 kg)   BMI 20.78 kg/m    Wt Readings from Last 3 Encounters:  06/12/23 110 lb (49.9 kg)  05/25/23 113 lb 3.2 oz (51.3 kg)  05/13/23 110 lb (49.9 kg)    GEN: Well nourished, well developed in no acute distress NECK: No JVD; No carotid bruits CARDIAC: RRR, no murmurs, rubs, gallops RESPIRATORY:  Clear to auscultation without rales, wheezing or rhonchi  ABDOMEN: Soft, non-tender, non-distended EXTREMITIES:  No edema; No deformity  ASSESSMENT AND PLAN: .   Assessment and Plan    Coronary Artery Disease (CAD) Post percutaneous coronary intervention (PCI) to the right coronary artery on 04/28/2023. No recurrent chest pain. -Continue dual antiplatelet therapy (DAPT) with Eliquis and Plavix for 6 months post-PCI. -After 6 months, continue Eliquis indefinitely.  Atrial Fibrillation (AFib) Brief episode post-PCI, no recurrence. -Continue Eliquis and Plavix for 6 months, then Eliquis indefinitely. -TSH today as well -EF normal  Hyperlipidemia Last LDL was 182, on Crestor 20mg . -Recheck lipids today. -Continue Crestor 20mg .  Hypertension Blood pressure 100/60 with some dizziness on standing. -Stop Losartan. -Continue Coreg 3.125mg  BID.   Takotsubo cardiomyopathy -EF  recovered. BP too low. Continue coreg. Stop losartan.   Follow-up -Return in 5 months to reassess and plan to stop Plavix. -Annual follow-up with the physician.              Follow-up: Return in about 5 months (around 11/10/2023). Signed, Lenna Gilford. Flora Lipps, MD, [facility]  [facility]  [address] [place], [place] [address] [phone]  2:54 PM

## 2023-06-12 ENCOUNTER — Ambulatory Visit: Payer: PPO | Attending: Cardiovascular Disease | Admitting: Cardiovascular Disease

## 2023-06-12 ENCOUNTER — Other Ambulatory Visit (HOSPITAL_COMMUNITY): Payer: Self-pay

## 2023-06-12 VITALS — BP 100/60 | HR 73 | Ht 61.0 in | Wt 110.0 lb

## 2023-06-12 DIAGNOSIS — I25118 Atherosclerotic heart disease of native coronary artery with other forms of angina pectoris: Secondary | ICD-10-CM

## 2023-06-12 DIAGNOSIS — I4891 Unspecified atrial fibrillation: Secondary | ICD-10-CM | POA: Diagnosis not present

## 2023-06-12 DIAGNOSIS — I5022 Chronic systolic (congestive) heart failure: Secondary | ICD-10-CM

## 2023-06-12 DIAGNOSIS — E782 Mixed hyperlipidemia: Secondary | ICD-10-CM

## 2023-06-12 DIAGNOSIS — Z955 Presence of coronary angioplasty implant and graft: Secondary | ICD-10-CM

## 2023-06-12 DIAGNOSIS — I10 Essential (primary) hypertension: Secondary | ICD-10-CM | POA: Diagnosis not present

## 2023-06-12 MED ORDER — APIXABAN 5 MG PO TABS
5.0000 mg | ORAL_TABLET | Freq: Two times a day (BID) | ORAL | 11 refills | Status: DC
  Filled 2023-06-12: qty 60, 30d supply, fill #0
  Filled 2023-07-26: qty 60, 30d supply, fill #1

## 2023-06-12 MED ORDER — CLOPIDOGREL BISULFATE 75 MG PO TABS
75.0000 mg | ORAL_TABLET | Freq: Every day | ORAL | 3 refills | Status: DC
  Filled 2023-06-12 – 2023-07-26 (×4): qty 90, 90d supply, fill #0

## 2023-06-12 MED ORDER — CARVEDILOL 3.125 MG PO TABS
3.1250 mg | ORAL_TABLET | Freq: Two times a day (BID) | ORAL | 3 refills | Status: DC
  Filled 2023-06-12: qty 180, 90d supply, fill #0

## 2023-06-12 NOTE — Patient Instructions (Addendum)
Medication Instructions:  - STOP LOSARTAN    *If you need a refill on your cardiac medications before your next appointment, please call your pharmacy*   Lab Work: LIPIDS  TSH    If you have labs (blood work) drawn today and your tests are completely normal, you will receive your results only by: MyChart Message (if you have MyChart) OR A paper copy in the mail If you have any lab test that is abnormal or we need to change your treatment, we will call you to review the results.   Testing/Procedures: NONE    Follow-Up: At [facility], you and your health needs are our priority.  As part of our continuing mission to provide you with exceptional heart care, we have created designated Provider Care Teams.  These Care Teams include your primary Cardiologist (physician) and Advanced Practice Providers (APPs -  Physician Assistants and Nurse Practitioners) who all work together to provide you with the care you need, when you need it.  We recommend signing up for the patient portal called "MyChart".  Sign up information is provided on this After Visit Summary.  MyChart is used to connect with patients for Virtual Visits (Telemedicine).  Patients are able to view lab/test results, encounter notes, upcoming appointments, etc.  Non-urgent messages can be sent to your provider as well.   To learn more about what you can do with MyChart, go to [email or web address].    Your next appointment:   5 month(s)  The format for your next appointment:   In Person  Provider:   Edd Fabian, FNP, Micah Flesher, PA-C, Marjie Skiff, PA-C, Robet Leu, PA-C, Juanda Crumble, PA-C, Joni Reining, DNP, ANP, Azalee Course, PA-C, or Bernadene Person, NP    Then, Reatha Harps, MD will plan to see you again in 1 year(s).   Other Instructions

## 2023-06-13 LAB — LIPID PANEL
Chol/HDL Ratio: 2.2 {ratio} (ref 0.0–4.4)
Cholesterol, Total: 154 mg/dL (ref 100–199)
HDL: 69 mg/dL (ref >39)
LDL Chol Calc (NIH): 69 mg/dL (ref 0–99)
Triglycerides: 88 mg/dL (ref 0–149)
VLDL Cholesterol Cal: 16 mg/dL (ref 5–40)

## 2023-06-13 LAB — TSH: TSH: 5.88 u[IU]/mL — ABNORMAL HIGH (ref 0.450–4.500)

## 2023-06-14 ENCOUNTER — Encounter: Payer: Self-pay | Admitting: Cardiovascular Disease

## 2023-06-15 ENCOUNTER — Other Ambulatory Visit: Payer: Self-pay

## 2023-06-15 MED ORDER — ROSUVASTATIN CALCIUM 40 MG PO TABS: 40.0000 mg | ORAL_TABLET | Freq: Every day | ORAL | 3 refills | Status: DC

## 2023-06-16 NOTE — Telephone Encounter (Signed)
Called and spoke with pt to go over ONO results. She verbalized understanding nfn

## 2023-06-18 ENCOUNTER — Telehealth: Payer: Self-pay | Admitting: Cardiovascular Disease

## 2023-06-18 NOTE — Telephone Encounter (Signed)
-----   Message from Reatha Harps sent at 06/14/2023  8:33 PM EST ----- Regarding: Cancel 2/6 appointment Tacey Ruiz,  Can you cancel her February appointment and have her see me in April?  Gerri Spore T. Flora Lipps, MD, [facility]  [facility]  [address] [place], [place] [address] [phone]  8:34 PM

## 2023-06-18 NOTE — Telephone Encounter (Signed)
06/18/23 LVM to schedule f/u appt in April with Dr. Flora Lipps - LCN

## 2023-06-20 ENCOUNTER — Other Ambulatory Visit: Payer: Self-pay | Admitting: Cardiology

## 2023-06-22 ENCOUNTER — Other Ambulatory Visit (HOSPITAL_COMMUNITY): Payer: Self-pay

## 2023-06-23 ENCOUNTER — Other Ambulatory Visit (HOSPITAL_COMMUNITY): Payer: Self-pay

## 2023-06-23 MED ORDER — ROSUVASTATIN CALCIUM 40 MG PO TABS
40.0000 mg | ORAL_TABLET | Freq: Every day | ORAL | 3 refills | Status: DC
  Filled 2023-06-23: qty 90, 90d supply, fill #0

## 2023-06-23 MED ORDER — PANTOPRAZOLE SODIUM 40 MG PO TBEC
40.0000 mg | DELAYED_RELEASE_TABLET | Freq: Every day | ORAL | 1 refills | Status: AC
  Filled 2023-06-23: qty 30, 30d supply, fill #0
  Filled 2023-07-26: qty 30, 30d supply, fill #1

## 2023-06-23 NOTE — Telephone Encounter (Signed)
Dr. Marylene Buerger Pamela Mcclain. She was prescribed Pantoprazole in [facility]. Does Dr. Flora Lipps want to refill? Please advise.

## 2023-06-24 ENCOUNTER — Other Ambulatory Visit: Payer: Self-pay | Admitting: Nurse Practitioner

## 2023-06-24 ENCOUNTER — Other Ambulatory Visit (HOSPITAL_COMMUNITY): Payer: Self-pay

## 2023-06-24 DIAGNOSIS — J432 Centrilobular emphysema: Secondary | ICD-10-CM

## 2023-06-24 MED FILL — Albuterol Sulfate Inhal Aero 108 MCG/ACT (90MCG Base Equiv): RESPIRATORY_TRACT | 25 days supply | Qty: 18 | Fill #0 | Status: AC

## 2023-06-25 ENCOUNTER — Other Ambulatory Visit: Payer: Self-pay

## 2023-07-02 ENCOUNTER — Ambulatory Visit: Payer: Medicaid Other | Admitting: Cardiovascular Disease

## 2023-07-03 ENCOUNTER — Other Ambulatory Visit (HOSPITAL_COMMUNITY): Payer: Self-pay

## 2023-07-03 ENCOUNTER — Ambulatory Visit: Payer: Medicaid Other | Admitting: Cardiovascular Disease

## 2023-07-10 ENCOUNTER — Ambulatory Visit (INDEPENDENT_AMBULATORY_CARE_PROVIDER_SITE_OTHER): Payer: PPO | Admitting: Nurse Practitioner

## 2023-07-10 ENCOUNTER — Encounter: Payer: Self-pay | Admitting: Nurse Practitioner

## 2023-07-10 VITALS — BP 117/75 | HR 68 | Temp 97.9°F | Wt 109.0 lb

## 2023-07-10 DIAGNOSIS — R7989 Other specified abnormal findings of blood chemistry: Secondary | ICD-10-CM | POA: Diagnosis not present

## 2023-07-10 NOTE — Progress Notes (Signed)
Subjective   Patient ID: Pamela Mcclain, female    DOB: [birth year], 63 y.o.   MRN: [id number]  Chief Complaint  Patient presents with   Medical Management of Chronic Issues    Referring provider: Ivonne Andrew, NP  Pamela Mcclain is a 63 y.o. female with Past Medical History: No date: CHF (congestive heart failure) (HCC) No date: COPD (chronic obstructive pulmonary disease) (HCC) No date: Coronary artery disease No date: Hyperlipidemia No date: Hypertension   HPI  Patient presents today to follow-up on thyroid.  Recent TSH with cardiology office was elevated.  We will check complete thyroid panel today.  Overall patient has been doing well since her last visit here.  She does continue to follow with pulmonary and cardiology. Denies f/c/s, n/v/d, hemoptysis, PND, leg swelling Denies chest pain or edema     No Known Allergies   There is no immunization history on file for this patient.  Tobacco History: Social History   Tobacco Use  Smoking Status Former   Current packs/day: 0.00   Average packs/day: 2.0 packs/day for 38.0 years (76.0 ttl pk-yrs)   Types: Cigarettes   Start date: 05/26/1978   Quit date: 05/26/2016   Years since quitting: 7.1  Smokeless Tobacco Never   Counseling given: Not Answered   Outpatient Encounter Medications as of 07/10/2023  Medication Sig   albuterol (PROVENTIL) (2.5 MG/3ML) 0.083% nebulizer solution Take 3 mLs (2.5 mg total) by nebulization every 6 (six) hours as needed for wheezing or shortness of breath.   albuterol (VENTOLIN HFA) 108 (90 Base) MCG/ACT inhaler Inhale 2 puffs into the lungs every 6 (six) hours as needed.   apixaban (ELIQUIS) 5 MG TABS tablet Take 1 tablet (5 mg total) by mouth 2 (two) times daily.   Budeson-Glycopyrrol-Formoterol (BREZTRI AEROSPHERE) 160-9-4.8 MCG/ACT AERO Inhale 2 puffs into the lungs in the morning and at bedtime.   carvedilol (COREG) 3.125 MG tablet Take 1 tablet (3.125 mg total) by mouth 2 (two)  times daily with a meal.   clopidogrel (PLAVIX) 75 MG tablet Take 1 tablet (75 mg total) by mouth daily.   Multiple Vitamins-Minerals (ZINC PO) Take 1 tablet by mouth daily.   nitroGLYCERIN (NITROSTAT) 0.4 MG SL tablet Place 1 tablet (0.4 mg total) under the tongue every 5 (five) minutes as needed for chest pain.   pantoprazole (PROTONIX) 40 MG tablet Take 1 tablet (40 mg total) by mouth daily.   rosuvastatin (CRESTOR) 40 MG tablet Take 1 tablet (40 mg total) by mouth daily.   rosuvastatin (CRESTOR) 40 MG tablet Take 1 tablet (40 mg total) by mouth daily.   Ascorbic Acid (VITAMIN C PO) Take 1 tablet by mouth daily. (Patient not taking: Reported on 07/10/2023)   No facility-administered encounter medications on file as of 07/10/2023.    Review of Systems  Review of Systems  Constitutional: Negative.   HENT: Negative.    Cardiovascular: Negative.   Gastrointestinal: Negative.   Allergic/Immunologic: Negative.   Neurological: Negative.   Psychiatric/Behavioral: Negative.       Objective:   BP 117/75   Pulse 68   Temp 97.9 F (36.6 C)   Wt 109 lb (49.4 kg)   SpO2 98%   BMI 20.60 kg/m   Wt Readings from Last 5 Encounters:  07/10/23 109 lb (49.4 kg)  06/12/23 110 lb (49.9 kg)  05/25/23 113 lb 3.2 oz (51.3 kg)  05/13/23 110 lb (49.9 kg)  04/28/23 112 lb (50.8 kg)     Physical Exam  Vitals and nursing note reviewed.  Constitutional:      General: She is not in acute distress.    Appearance: She is well-developed.  Cardiovascular:     Rate and Rhythm: Normal rate and regular rhythm.  Pulmonary:     Effort: Pulmonary effort is normal.     Breath sounds: Normal breath sounds.  Neurological:     Mental Status: She is alert and oriented to person, place, and time.       Assessment & Plan:   Elevated TSH -     Thyroid Panel With TSH     Return in about 6 months (around 01/07/2024).   Ivonne Andrew, NP 07/10/2023

## 2023-07-10 NOTE — Patient Instructions (Addendum)
1. Elevated TSH (Primary)  - Thyroid Panel With TSH  Follow up:  Follow up in 6 months

## 2023-07-13 ENCOUNTER — Other Ambulatory Visit: Payer: Self-pay | Admitting: Nurse Practitioner

## 2023-07-14 ENCOUNTER — Other Ambulatory Visit (HOSPITAL_COMMUNITY): Payer: Self-pay

## 2023-07-14 ENCOUNTER — Other Ambulatory Visit: Payer: Self-pay | Admitting: Nurse Practitioner

## 2023-07-14 LAB — THYROID PANEL WITH TSH
Free Thyroxine Index: 2 (ref 1.2–4.9)
T3 Uptake Ratio: 23 % — ABNORMAL LOW (ref 24–39)
T4, Total: 8.8 ug/dL (ref 4.5–12.0)
TSH: 5.35 u[IU]/mL — ABNORMAL HIGH (ref 0.450–4.500)

## 2023-07-14 MED ORDER — LEVOTHYROXINE SODIUM 50 MCG PO TABS
50.0000 ug | ORAL_TABLET | Freq: Every day | ORAL | 11 refills | Status: DC
  Filled 2023-07-14 (×2): qty 30, 30d supply, fill #0
  Filled 2023-07-28 – 2023-08-11 (×2): qty 30, 30d supply, fill #1

## 2023-07-26 MED FILL — Albuterol Sulfate Inhal Aero 108 MCG/ACT (90MCG Base Equiv): RESPIRATORY_TRACT | 30 days supply | Qty: 18 | Fill #1 | Status: CN

## 2023-07-27 ENCOUNTER — Other Ambulatory Visit: Payer: Self-pay

## 2023-07-27 ENCOUNTER — Other Ambulatory Visit (HOSPITAL_COMMUNITY): Payer: Self-pay

## 2023-07-27 MED FILL — Albuterol Sulfate Inhal Aero 108 MCG/ACT (90MCG Base Equiv): RESPIRATORY_TRACT | 17 days supply | Qty: 6.7 | Fill #1 | Status: AC

## 2023-07-28 ENCOUNTER — Other Ambulatory Visit (HOSPITAL_COMMUNITY): Payer: Self-pay

## 2023-07-29 ENCOUNTER — Encounter: Payer: Self-pay | Admitting: Cardiovascular Disease

## 2023-08-10 ENCOUNTER — Encounter: Payer: Self-pay | Admitting: Cardiovascular Disease

## 2023-08-11 ENCOUNTER — Other Ambulatory Visit (HOSPITAL_COMMUNITY): Payer: Self-pay

## 2023-08-20 ENCOUNTER — Other Ambulatory Visit (HOSPITAL_COMMUNITY): Payer: Self-pay

## 2023-08-30 NOTE — Progress Notes (Deleted)
  Cardiology Office Note:  .   Date:  08/30/2023  ID:  Pamela Mcclain, DOB 1960/05/30, MRN [id number] PCP: Pamela Andrew, NP  [facility] Providers Cardiologist:  Pamela Harps, MD { Click to update primary MD,subspecialty MD or APP then REFRESH:1}   History of Present Illness: .   No chief complaint on file.   Pamela Mcclain is a 63 y.o. female with history of CHF, CAD, pAF who presents for follow-up.      Problem List Takotsubo CM -EF 25-30%, apical ballooning 07/08/2021 -EF 55-60% 04/30/2023 CAD -PCI to pRCA 04/28/2023 -TPV 351 mm3 (calcified 40 mm3; non-calcified 311 mm3; low attenuation 2 mm3; 77th percentile; severe) -CAC 424 (95th percentile) 3. COPD 4. HLD -T chol 276, HDL 77, LDL 182, TG 101 -Lpa 16.1  5. Paroxysmal Afib  -CHADSVASC=3 (age, CAD, female)     ROS: All other ROS reviewed and negative. Pertinent positives noted in the HPI.     Studies Reviewed: Marland Kitchen       TTE 04/30/2023  1. Left ventricular ejection fraction, by estimation, is 55 to 60%. The  left ventricle has normal function. The left ventricle has no regional  wall motion abnormalities. Left ventricular diastolic parameters are  consistent with Grade I diastolic  dysfunction (impaired relaxation). The average left ventricular global  longitudinal strain is -18.1 %. The global longitudinal strain is normal.   2. Right ventricular systolic function is normal. The right ventricular  size is normal. There is normal pulmonary artery systolic pressure.   3. The mitral valve is normal in structure. Trivial mitral valve  regurgitation.   4. The aortic valve was not well visualized. Aortic valve regurgitation  is not visualized.   5. The inferior vena cava is normal in size with greater than 50%  respiratory variability, suggesting right atrial pressure of 3 mmHg.  Physical Exam:   VS:  There were no vitals taken for this visit.   Wt Readings from Last 3 Encounters:  07/10/23 109 lb (49.4 kg)   06/12/23 110 lb (49.9 kg)  05/25/23 113 lb 3.2 oz (51.3 kg)    GEN: Well nourished, well developed in no acute distress NECK: No JVD; No carotid bruits CARDIAC: ***RRR, no murmurs, rubs, gallops RESPIRATORY:  Clear to auscultation without rales, wheezing or rhonchi  ABDOMEN: Soft, non-tender, non-distended EXTREMITIES:  No edema; No deformity  ASSESSMENT AND PLAN: .   ***    {Are you ordering a CV Procedure (e.g. stress test, cath, DCCV, TEE, etc)?   Press F2        :[id number]}   Follow-up: No follow-ups on file.  Time Spent with Patient: I have spent a total of *** minutes caring for this patient today face to face, ordering and reviewing labs/tests, reviewing prior records/medical history, examining the patient, establishing an assessment and plan, communicating results/findings to the patient/family, and documenting in the medical record.   Signed, Pamela Mcclain. Flora Lipps, MD, [facility]  [facility]  [address], [address] [place], [place] [address] [phone]  7:48 PM

## 2023-08-31 ENCOUNTER — Ambulatory Visit: Payer: PPO | Attending: Cardiovascular Disease | Admitting: Cardiovascular Disease

## 2023-08-31 DIAGNOSIS — E782 Mixed hyperlipidemia: Secondary | ICD-10-CM

## 2023-08-31 DIAGNOSIS — I5022 Chronic systolic (congestive) heart failure: Secondary | ICD-10-CM

## 2023-08-31 DIAGNOSIS — Z955 Presence of coronary angioplasty implant and graft: Secondary | ICD-10-CM

## 2023-08-31 DIAGNOSIS — I480 Paroxysmal atrial fibrillation: Secondary | ICD-10-CM

## 2023-08-31 DIAGNOSIS — I25118 Atherosclerotic heart disease of native coronary artery with other forms of angina pectoris: Secondary | ICD-10-CM

## 2023-08-31 DIAGNOSIS — I10 Essential (primary) hypertension: Secondary | ICD-10-CM

## 2023-08-31 IMAGING — DX DG CHEST 1V PORT
1 series · 1 of 1 positions shown · non-contrast
Comparison: None.

CLINICAL DATA: Chest pain

EXAM:
PORTABLE CHEST 1 VIEW

[chest ap]
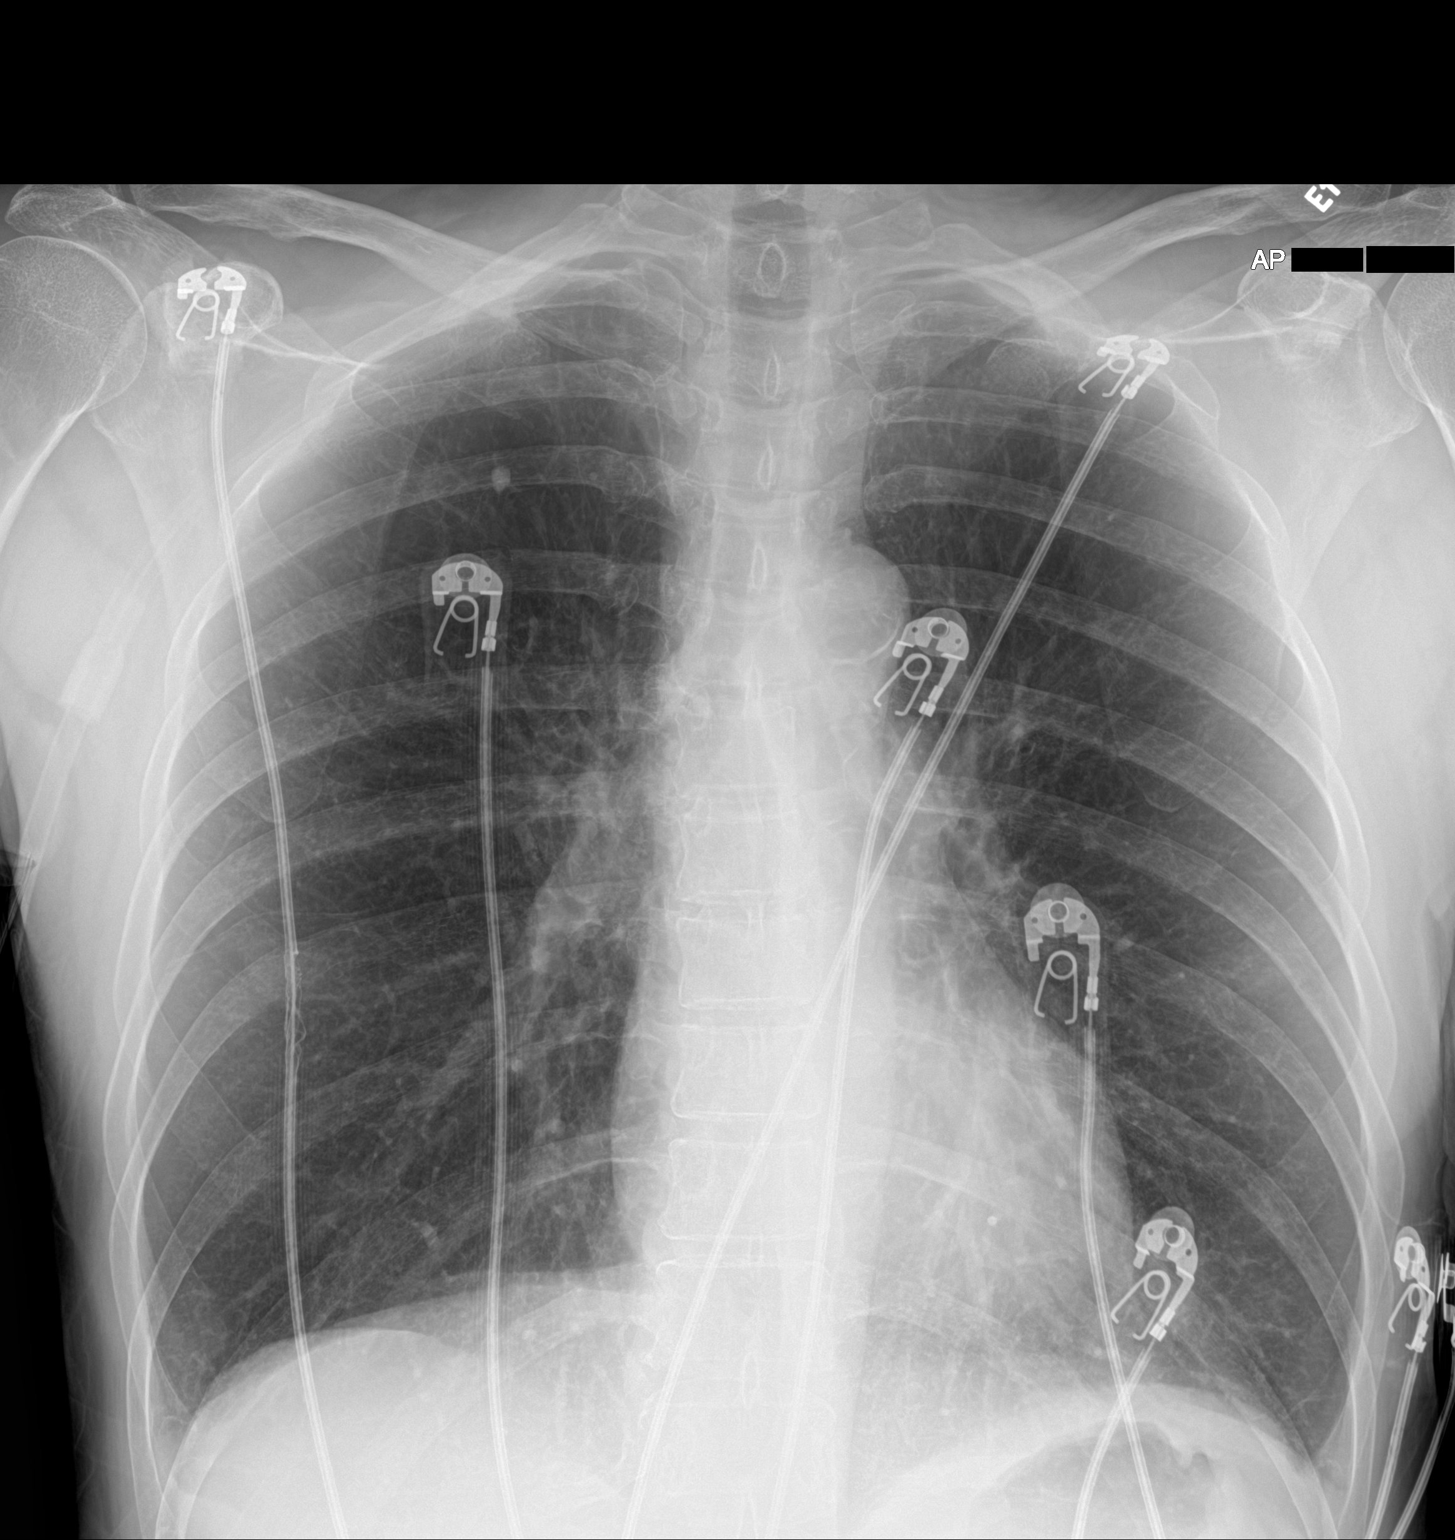

[1 of 1 positions shown; findings below may reference images not displayed]

FINDINGS: The heart size and mediastinal contours are within normal limits.
Both lungs are clear. Definitively benign, calcified nodule of the
right pulmonary apex. The visualized skeletal structures are
unremarkable.
IMPRESSION: No acute abnormality of the lungs in AP portable projection.

## 2023-09-16 ENCOUNTER — Other Ambulatory Visit: Payer: Self-pay | Admitting: Nurse Practitioner

## 2023-09-16 DIAGNOSIS — J432 Centrilobular emphysema: Secondary | ICD-10-CM

## 2023-10-26 ENCOUNTER — Ambulatory Visit: Payer: Medicare (Managed Care) | Admitting: Surgery

## 2023-10-29 ENCOUNTER — Encounter: Payer: Self-pay | Admitting: *Deleted

## 2023-11-12 ENCOUNTER — Encounter: Payer: Self-pay | Admitting: Emergency Medicine

## 2023-12-07 ENCOUNTER — Other Ambulatory Visit: Payer: Self-pay | Admitting: Nurse Practitioner

## 2023-12-07 DIAGNOSIS — J432 Centrilobular emphysema: Secondary | ICD-10-CM

## 2024-01-05 ENCOUNTER — Other Ambulatory Visit: Payer: Self-pay | Admitting: Nurse Practitioner

## 2024-01-05 DIAGNOSIS — J432 Centrilobular emphysema: Secondary | ICD-10-CM

## 2024-01-07 ENCOUNTER — Ambulatory Visit: Payer: Self-pay | Admitting: Nurse Practitioner

## 2024-01-21 ENCOUNTER — Ambulatory Visit: Payer: Self-pay | Admitting: Nurse Practitioner

## 2024-03-01 ENCOUNTER — Other Ambulatory Visit: Payer: Self-pay | Admitting: Cardiovascular Disease

## 2024-04-19 ENCOUNTER — Other Ambulatory Visit: Payer: Self-pay | Admitting: Nurse Practitioner

## 2024-04-19 ENCOUNTER — Other Ambulatory Visit: Payer: Self-pay | Admitting: Cardiovascular Disease

## 2024-04-19 DIAGNOSIS — J432 Centrilobular emphysema: Secondary | ICD-10-CM

## 2024-05-09 ENCOUNTER — Other Ambulatory Visit: Payer: Self-pay | Admitting: Cardiovascular Disease

## 2024-05-23 ENCOUNTER — Other Ambulatory Visit: Payer: Self-pay | Admitting: Cardiovascular Disease

## 2024-05-23 NOTE — Telephone Encounter (Signed)
 Prescription refill request for Eliquis  received. Indication:afib Last office visit:needs visit Scr: Age:  Weight: Prescription refilled

## 2024-06-01 ENCOUNTER — Other Ambulatory Visit: Payer: Self-pay | Admitting: Nurse Practitioner

## 2024-06-01 ENCOUNTER — Other Ambulatory Visit: Payer: Self-pay | Admitting: Cardiovascular Disease

## 2024-06-01 DIAGNOSIS — J432 Centrilobular emphysema: Secondary | ICD-10-CM

## 2024-06-01 DIAGNOSIS — J449 Chronic obstructive pulmonary disease, unspecified: Secondary | ICD-10-CM

## 2024-06-01 NOTE — Telephone Encounter (Signed)
 NEEDS CARDIOLOGY APPT FOR REFILLS, CALL OFFICE [phone].  THANK YOU 2 ATTEMPT

## 2024-06-01 NOTE — Telephone Encounter (Signed)
 Courtesy Breztri  refill - pt has not been seen since 05/29/23. Pt will need appt for further refills.

## 2024-06-01 NOTE — Telephone Encounter (Signed)
 Courtesy albuterol  refill - pt has not been seen since 05/29/23. Pt will need appt for further refills.

## 2024-06-01 NOTE — Telephone Encounter (Signed)
 levothyroxine  (SYNTHROID ) 50 MCG tablet [Pharmacy Med Name: LEVOTHYROXINE  SODIUM 50MCG TABLET]

## 2024-06-17 ENCOUNTER — Telehealth: Payer: Self-pay | Admitting: Nurse Practitioner

## 2024-06-17 NOTE — Telephone Encounter (Signed)
 levothyroxine  (SYNTHROID ) 50 MCG tablet [[id number]]

## 2024-06-21 ENCOUNTER — Other Ambulatory Visit: Payer: Self-pay | Admitting: Nurse Practitioner

## 2024-06-23 ENCOUNTER — Other Ambulatory Visit: Payer: Self-pay | Admitting: Cardiovascular Disease

## 2024-06-28 NOTE — Telephone Encounter (Signed)
 In accordance with refill protocols, please review and address the following requirements before this medication refill can be authorized:  Appointment  and Labs  Pt was called, but voice mail was full, unable to leave message. Pt overdue for appt and pt needs lipid panel and labs done within 365 days within normal range
